# Patient Record
Sex: Male | Born: 1940 | Race: White | Hispanic: No | Marital: Single | Smoking: Former smoker
Health system: Southern US, Community
[De-identification: ages and names within clinical notes are randomized; demographics above are authoritative.]

## PROBLEM LIST (undated history)

## (undated) DIAGNOSIS — I10 Essential (primary) hypertension: Secondary | ICD-10-CM

## (undated) DIAGNOSIS — I739 Peripheral vascular disease, unspecified: Secondary | ICD-10-CM

## (undated) DIAGNOSIS — M797 Fibromyalgia: Secondary | ICD-10-CM

## (undated) DIAGNOSIS — J449 Chronic obstructive pulmonary disease, unspecified: Secondary | ICD-10-CM

## (undated) DIAGNOSIS — G629 Polyneuropathy, unspecified: Secondary | ICD-10-CM

## (undated) DIAGNOSIS — E118 Type 2 diabetes mellitus with unspecified complications: Secondary | ICD-10-CM

## (undated) DIAGNOSIS — I779 Disorder of arteries and arterioles, unspecified: Secondary | ICD-10-CM

## (undated) DIAGNOSIS — E785 Hyperlipidemia, unspecified: Secondary | ICD-10-CM

## (undated) DIAGNOSIS — C801 Malignant (primary) neoplasm, unspecified: Secondary | ICD-10-CM

## (undated) DIAGNOSIS — I5189 Other ill-defined heart diseases: Secondary | ICD-10-CM

## (undated) DIAGNOSIS — R001 Bradycardia, unspecified: Secondary | ICD-10-CM

## (undated) DIAGNOSIS — I2510 Atherosclerotic heart disease of native coronary artery without angina pectoris: Secondary | ICD-10-CM

## (undated) DIAGNOSIS — D126 Benign neoplasm of colon, unspecified: Secondary | ICD-10-CM

## (undated) HISTORY — DX: Atherosclerotic heart disease of native coronary artery without angina pectoris: I25.10

## (undated) HISTORY — DX: Malignant (primary) neoplasm, unspecified: C80.1

## (undated) HISTORY — DX: Essential (primary) hypertension: I10

## (undated) HISTORY — DX: Benign neoplasm of colon, unspecified: D12.6

## (undated) HISTORY — DX: Chronic obstructive pulmonary disease, unspecified: J44.9

## (undated) HISTORY — DX: Disorder of arteries and arterioles, unspecified: I77.9

## (undated) HISTORY — PX: EYE SURGERY: SHX253

## (undated) HISTORY — PX: CAROTID ENDARTERECTOMY: SUR193

## (undated) HISTORY — DX: Type 2 diabetes mellitus with unspecified complications: E11.8

## (undated) HISTORY — DX: Hyperlipidemia, unspecified: E78.5

## (undated) HISTORY — PX: SKIN CANCER EXCISION: SHX779

## (undated) HISTORY — DX: Other ill-defined heart diseases: I51.89

## (undated) HISTORY — DX: Bradycardia, unspecified: R00.1

## (undated) HISTORY — DX: Peripheral vascular disease, unspecified: I73.9

## (undated) HISTORY — DX: Polyneuropathy, unspecified: G62.9

---

## 2006-03-08 ENCOUNTER — Emergency Department: Payer: Self-pay | Admitting: Unknown Physician Specialty

## 2008-08-23 ENCOUNTER — Ambulatory Visit: Payer: Self-pay | Admitting: Vascular Surgery

## 2008-10-10 ENCOUNTER — Ambulatory Visit: Payer: Self-pay | Admitting: Vascular Surgery

## 2008-10-17 ENCOUNTER — Inpatient Hospital Stay: Payer: Self-pay | Admitting: Vascular Surgery

## 2013-01-26 ENCOUNTER — Ambulatory Visit: Payer: Self-pay | Admitting: Internal Medicine

## 2013-04-07 ENCOUNTER — Ambulatory Visit: Payer: Self-pay | Admitting: Internal Medicine

## 2013-04-17 ENCOUNTER — Ambulatory Visit: Payer: Self-pay | Admitting: Gastroenterology

## 2013-04-17 LAB — CBC WITH DIFFERENTIAL/PLATELET
Basophil %: 1.2 %
Eosinophil #: 0.2 10*3/uL (ref 0.0–0.7)
Lymphocyte #: 1.3 10*3/uL (ref 1.0–3.6)
Lymphocyte %: 19.4 %
MCH: 33.3 pg (ref 26.0–34.0)
MCHC: 34.8 g/dL (ref 32.0–36.0)
MCV: 96 fL (ref 80–100)
Neutrophil #: 4.4 10*3/uL (ref 1.4–6.5)
Neutrophil %: 65.1 %
Platelet: 243 10*3/uL (ref 150–440)
WBC: 6.8 10*3/uL (ref 3.8–10.6)

## 2013-04-17 LAB — PROTIME-INR: Prothrombin Time: 12.7 secs (ref 11.5–14.7)

## 2013-06-28 ENCOUNTER — Ambulatory Visit: Payer: Self-pay | Admitting: Ophthalmology

## 2014-06-06 ENCOUNTER — Emergency Department: Payer: Self-pay | Admitting: Emergency Medicine

## 2014-06-06 LAB — BASIC METABOLIC PANEL
ANION GAP: 10 (ref 7–16)
BUN: 13 mg/dL (ref 7–18)
CALCIUM: 9.1 mg/dL (ref 8.5–10.1)
CHLORIDE: 105 mmol/L (ref 98–107)
CO2: 24 mmol/L (ref 21–32)
Creatinine: 1.05 mg/dL (ref 0.60–1.30)
EGFR (African American): >60
EGFR (Non-African Amer.): >60
Glucose: 138 mg/dL — ABNORMAL HIGH (ref 65–99)
OSMOLALITY: 280 (ref 275–301)
Potassium: 4.3 mmol/L (ref 3.5–5.1)
SODIUM: 139 mmol/L (ref 136–145)

## 2014-06-06 LAB — URINALYSIS, COMPLETE
BILIRUBIN, UR: NEGATIVE
Bacteria: NONE SEEN
Blood: NEGATIVE
GLUCOSE, UR: NEGATIVE mg/dL (ref 0–75)
Ketone: NEGATIVE
Leukocyte Esterase: NEGATIVE
Nitrite: NEGATIVE
PH: 5 (ref 4.5–8.0)
Protein: NEGATIVE
Specific Gravity: 1.029 (ref 1.003–1.030)
Squamous Epithelial: NONE SEEN

## 2014-06-06 LAB — CBC
HCT: 46.5 % (ref 40.0–52.0)
HGB: 15.9 g/dL (ref 13.0–18.0)
MCH: 34.9 pg — AB (ref 26.0–34.0)
MCHC: 34.1 g/dL (ref 32.0–36.0)
MCV: 102 fL — ABNORMAL HIGH (ref 80–100)
Platelet: 153 10*3/uL (ref 150–440)
RBC: 4.55 10*6/uL (ref 4.40–5.90)
RDW: 14.6 % — ABNORMAL HIGH (ref 11.5–14.5)
WBC: 7.6 10*3/uL (ref 3.8–10.6)

## 2014-06-06 LAB — CK TOTAL AND CKMB (NOT AT ARMC)
CK, Total: 109 U/L
CK-MB: 1.6 ng/mL (ref 0.5–3.6)

## 2014-06-06 LAB — TROPONIN I: Troponin-I: <0.02 ng/mL

## 2016-04-27 ENCOUNTER — Ambulatory Visit
Admission: EM | Admit: 2016-04-27 | Discharge: 2016-04-27 | Disposition: A | Discharge status: Home / Self Care | Payer: Medicare Other | Attending: Family Medicine | Admitting: Family Medicine

## 2016-04-27 ENCOUNTER — Encounter: Payer: Self-pay | Admitting: *Deleted

## 2016-04-27 ENCOUNTER — Ambulatory Visit (INDEPENDENT_AMBULATORY_CARE_PROVIDER_SITE_OTHER): Payer: Medicare Other

## 2016-04-27 DIAGNOSIS — M1000 Idiopathic gout, unspecified site: Secondary | ICD-10-CM

## 2016-04-27 DIAGNOSIS — S63501A Unspecified sprain of right wrist, initial encounter: Secondary | ICD-10-CM

## 2016-04-27 HISTORY — DX: Fibromyalgia: M79.7

## 2016-04-27 LAB — URIC ACID: URIC ACID, SERUM: 7.3 mg/dL (ref 4.4–7.6)

## 2016-04-27 MED ORDER — PREDNISONE 20 MG PO TABS
ORAL_TABLET | ORAL | 0 refills | Status: DC
Start: 2016-04-27 — End: 2018-07-09

## 2016-04-27 NOTE — ED Triage Notes (Signed)
Pt has hx of previous right wrist fx with recurrent pain that usually resolves spontaneously but this time has persisted for more than a week and is not improving.  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 Wrist is edematous.

## 2016-04-27 NOTE — ED Provider Notes (Signed)
[facility]    CSN: [id number] Arrival date & time: 04/27/16  1004  First Provider Contact:  First MD Initiated Contact with Patient 04/27/16 1124        History   Chief Complaint Chief Complaint  Patient presents with  . Wrist Pain    HPI Gerald NOWLING Sr. is a 75 y.o. male.   HPI: Patient presents today with symptoms of right wrist pain. Patient states that he's had the symptoms for the last 5 or 6 days. He denies any trauma or injury to the area. He states that gradually it has started to get worse over the course of the last few days. He does describe some warmth to the area and swelling. He denies any other joint pain. He denies any fever. There is not one specific spot on the wrist that bothers him. He denies any history of gout. Denies any recent increase of seafood, red meat or alcohol.  Past Medical History:  Diagnosis Date  . Fibromyalgia     There are no active problems to display for this patient.   Past Surgical History:  Procedure Laterality Date  . CAROTID ENDARTERECTOMY Right   . EYE SURGERY         Home Medications    Prior to Admission medications   Medication Sig Start Date End Date Taking? Authorizing Provider  acetaminophen (TYLENOL) 500 MG tablet Take 1,000 mg by mouth every 6 (six) hours as needed.   Yes Historical Provider, MD  aspirin 81 MG tablet Take 81 mg by mouth daily.   Yes Historical Provider, MD  omeprazole (PRILOSEC) 20 MG capsule Take 20 mg by mouth daily.   Yes Historical Provider, MD  predniSONE (DELTASONE) 20 MG tablet Take 2 tabs PO qd x 3 days. 04/27/16   Paulina Fusi, MD    Family History History reviewed. No pertinent family history.  Social History Social History  Substance Use Topics  . Smoking status: Former Research scientist (life sciences)  . Smokeless tobacco: Current User  . Alcohol use Yes     Allergies   Review of patient's allergies indicates no known allergies.   Review of Systems Review of Systems: Negative  except mentioned above.   Physical Exam Triage Vital Signs ED Triage Vitals [04/27/16 1037]  Enc Vitals Group     BP (!) 148/66     Pulse Rate 60     Resp 16     Temp 97.8 F (36.6 C)     Temp Source Oral     SpO2 98 %     Weight 170 lb (77.1 kg)     Height 5\' 10"  (1.778 m)     Head Circumference      Peak Flow      Pain Score      Pain Loc      Pain Edu?      Excl. in [place]?    No data found.   Updated Vital Signs BP (!) 148/66 (BP Location: Left Arm)   Pulse 60   Temp 97.8 F (36.6 C) (Oral)   Resp 16   Ht 5\' 10"  (1.778 m)   Wt 170 lb (77.1 kg)   SpO2 98%   BMI 24.39 kg/m      Physical Exam :  GENERAL: NAD RESP: CTA B CARD: RRR MSK: R Wrist-mild to moderate swelling of right wrist, mild warmth of area, decreased ROM, generalized tenderness of wrist, nv intact  NEURO: CN II-XII grossly intact   UC  Treatments / Results  Labs (all labs ordered are listed, but only abnormal results are displayed) Labs Reviewed  URIC ACID    EKG  EKG Interpretation None       Radiology Dg Wrist Complete Right  Result Date: 04/27/2016 CLINICAL DATA:  Right wrist pain over the past 1 week. No known injury. Initial encounter. EXAM: RIGHT WRIST - COMPLETE 3+ VIEW COMPARISON:  None. FINDINGS: No acute bony or joint abnormality is seen. 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 Joint spaces are preserved. No focal bone lesion. IMPRESSION: No acute abnormality. Chondrocalcinosis. Electronically Signed   By: Inge Rise M.D.   On: 04/27/2016 11:26    Procedures Procedures (including critical care time)  Medications Ordered in UC Medications - No data to display   Initial Impression / Assessment and Plan / UC Course  I have reviewed the triage vital signs and the nursing notes.  Pertinent labs & imaging results that were available during my care of the patient were reviewed by me and considered in my medical decision making (see chart for details).  Clinical  Course  A/P: R Wrist Pain- x-ray results discussed with patient, uric acid level upper limits of normal, will place patient on a few days of prednisone in case patient is having a flare of gout. Patient states that he has taken prednisone in the past before. I have advised that he not take NSAIDs while on the prednisone but can take Tylenol. He can use his wrist splint that he has if needed. Ice, elevation when necessary. Would follow-up with orthopedics or primary care physician if symptoms persist or worsen as discussed.  Final Clinical Impressions(s) / UC Diagnoses   Final diagnoses:  Right wrist sprain, initial encounter  Acute idiopathic gout, unspecified site    New Prescriptions New Prescriptions   PREDNISONE (DELTASONE) 20 MG TABLET    Take 2 tabs PO qd x 3 days.     Paulina Fusi, MD 04/27/16 [phone]

## 2016-07-21 ENCOUNTER — Encounter: Payer: Self-pay | Admitting: *Deleted

## 2016-07-21 ENCOUNTER — Ambulatory Visit: Payer: Medicare Other | Admitting: Anesthesiology

## 2016-07-21 ENCOUNTER — Ambulatory Visit
Admission: RE | Admit: 2016-07-21 | Discharge: 2016-07-21 | Disposition: A | Discharge status: Home / Self Care | Payer: Medicare Other | Source: Ambulatory Visit | Attending: Gastroenterology | Admitting: Gastroenterology

## 2016-07-21 ENCOUNTER — Encounter
Admission: RE | Disposition: A | Discharge status: Home / Self Care | Payer: Self-pay | Source: Ambulatory Visit | Attending: Gastroenterology

## 2016-07-21 DIAGNOSIS — K635 Polyp of colon: Secondary | ICD-10-CM | POA: Insufficient documentation

## 2016-07-21 DIAGNOSIS — M797 Fibromyalgia: Secondary | ICD-10-CM | POA: Diagnosis not present

## 2016-07-21 DIAGNOSIS — Z7982 Long term (current) use of aspirin: Secondary | ICD-10-CM | POA: Insufficient documentation

## 2016-07-21 DIAGNOSIS — Z1211 Encounter for screening for malignant neoplasm of colon: Secondary | ICD-10-CM | POA: Diagnosis not present

## 2016-07-21 DIAGNOSIS — K5730 Diverticulosis of large intestine without perforation or abscess without bleeding: Secondary | ICD-10-CM | POA: Insufficient documentation

## 2016-07-21 DIAGNOSIS — Z87891 Personal history of nicotine dependence: Secondary | ICD-10-CM | POA: Diagnosis not present

## 2016-07-21 DIAGNOSIS — Z86010 Personal history of colonic polyps: Secondary | ICD-10-CM | POA: Insufficient documentation

## 2016-07-21 DIAGNOSIS — D122 Benign neoplasm of ascending colon: Secondary | ICD-10-CM | POA: Diagnosis not present

## 2016-07-21 HISTORY — PX: COLONOSCOPY: SHX5424

## 2016-07-21 LAB — CBC
HCT: 47.3 % (ref 40.0–52.0)
HEMOGLOBIN: 16.1 g/dL (ref 13.0–18.0)
MCH: 33.8 pg (ref 26.0–34.0)
MCHC: 33.9 g/dL (ref 32.0–36.0)
MCV: 99.4 fL (ref 80.0–100.0)
Platelets: 152 10*3/uL (ref 150–440)
RBC: 4.76 MIL/uL (ref 4.40–5.90)
RDW: 13.6 % (ref 11.5–14.5)
WBC: 8 10*3/uL (ref 3.8–10.6)

## 2016-07-21 LAB — PROTIME-INR
INR: 0.96
Prothrombin Time: 12.8 seconds (ref 11.4–15.2)

## 2016-07-21 SURGERY — COLONOSCOPY
Anesthesia: General

## 2016-07-21 MED ORDER — EPHEDRINE SULFATE 50 MG/ML IJ SOLN
INTRAMUSCULAR | Status: DC | PRN
  Administered 2016-07-21: 10 mg via INTRAVENOUS

## 2016-07-21 MED ORDER — SODIUM CHLORIDE 0.9 % IV SOLN: INTRAVENOUS | Status: DC

## 2016-07-21 MED ORDER — FENTANYL CITRATE (PF) 100 MCG/2ML IJ SOLN
INTRAMUSCULAR | Status: DC | PRN
  Administered 2016-07-21: 50 ug via INTRAVENOUS

## 2016-07-21 MED ORDER — MIDAZOLAM HCL 2 MG/2ML IJ SOLN
INTRAMUSCULAR | Status: DC | PRN
  Administered 2016-07-21: 1 mg via INTRAVENOUS

## 2016-07-21 MED ORDER — SODIUM CHLORIDE 0.9 % IV SOLN
INTRAVENOUS | Status: DC
  Administered 2016-07-21: 07:00:00 via INTRAVENOUS

## 2016-07-21 MED ORDER — PROPOFOL 500 MG/50ML IV EMUL
INTRAVENOUS | Status: DC | PRN
  Administered 2016-07-21: 120 ug/kg/min via INTRAVENOUS

## 2016-07-21 NOTE — H&P (Deleted)
Outpatient short stay form Pre-procedure 07/21/2016 7:45 AM Gerald Mcgrath U Maliea Grandmaison MD  Primary Physician: Dr. Mark Crissman  Reason for visit:  Colonoscopy  History of present illness:  Patient is a 75-year-old male presenting today as above. He has a personal history of adenomatous colon polyps. He does take Plavix but stopped that about 6 days ago. He does take a daily 81 mg aspirin withheld at this morning. He tolerated his prep well.    Current Facility-Administered Medications:  .  0.9 %  sodium chloride infusion, , Intravenous, Continuous, Cheston Coury U Zaccheus Edmister, MD, Last Rate: 20 mL/hr at 07/21/16 0723 .  0.9 %  sodium chloride infusion, , Intravenous, Continuous, Jibril Mcminn U Morgane Joerger, MD  Prescriptions Prior to Admission  Medication Sig Dispense Refill Last Dose  . acetaminophen (TYLENOL) 500 MG tablet Take 1,000 mg by mouth every 6 (six) hours as needed.   Past Month at Unknown time  . aspirin 81 MG tablet Take 81 mg by mouth daily.   Past Week at Unknown time  . omeprazole (PRILOSEC) 20 MG capsule Take 20 mg by mouth daily.   Not Taking at Unknown time  . predniSONE (DELTASONE) 20 MG tablet Take 2 tabs PO qd x 3 days. (Patient not taking: Reported on 07/21/2016) 6 tablet 0 Completed Course at Unknown time     No Known Allergies   Past Medical History:  Diagnosis Date  . Fibromyalgia   . Wrist pain    right. to see orthopedist next week    Review of systems:      Physical Exam    Heart and lungs: Regular rate and rhythm without rub or gallop, lungs are bilaterally clear.    HEENT: Normocephalic atraumatic eyes are anicteric    Other:     Pertinant exam for procedure: Soft nontender nondistended bowel sounds positive normoactive.    Planned proceedures: Colonoscopy and indicated procedures. I have discussed the risks benefits and complications of procedures to include not limited to bleeding, infection, perforation and the risk of sedation and the patient wishes to  proceed.    Gerald Mcgrath U Gerald Ghosh, MD Gastroenterology 07/21/2016  7:45 AM     

## 2016-07-21 NOTE — H&P (Signed)
Outpatient short stay form Pre-procedure 07/21/2016 8:42 AM Lollie Sails MD  Primary Physician: Dr. Fulton Reek  Reason for visit:  Colonoscopy  History of present illness:  Patient is a 75 year old male presenting today as above. He has personal history of adenomatous colon polyps with a large adenomatous polyp being removed from the rectum. This was on 04/17/2013. He is presenting today for follow-up colonoscopy. He tolerated his prep well. He takes no blood thinning agents. He takes no aspirin products with the exception of 81 mg aspirin.    Current Facility-Administered Medications:  .  0.9 %  sodium chloride infusion, , Intravenous, Continuous, Lollie Sails, MD, Last Rate: 20 mL/hr at 07/21/16 0723 .  0.9 %  sodium chloride infusion, , Intravenous, Continuous, Lollie Sails, MD .  0.9 %  sodium chloride infusion, , Intravenous, Continuous, Lollie Sails, MD .  0.9 %  sodium chloride infusion, , Intravenous, Continuous, Lollie Sails, MD  Prescriptions Prior to Admission  Medication Sig Dispense Refill Last Dose  . acetaminophen (TYLENOL) 500 MG tablet Take 1,000 mg by mouth every 6 (six) hours as needed.   Past Month at Unknown time  . aspirin 81 MG tablet Take 81 mg by mouth daily.   Past Week at Unknown time  . omeprazole (PRILOSEC) 20 MG capsule Take 20 mg by mouth daily.   Not Taking at Unknown time  . predniSONE (DELTASONE) 20 MG tablet Take 2 tabs PO qd x 3 days. (Patient not taking: Reported on 07/21/2016) 6 tablet 0 Completed Course at Unknown time     No Known Allergies   Past Medical History:  Diagnosis Date  . Fibromyalgia   . Wrist pain    right. to see orthopedist next week    Review of systems:      Physical Exam    Heart and lungs: Regular rate and rhythm without rub or gallop, lungs are bilaterally clear.    HEENT: Normocephalic atraumatic eyes are anicteric    Other:     Pertinant exam for procedure: Soft nontender  nondistended bowel sounds positive normoactive.    Planned proceedures: Colonoscopy and indicated procedures. I have discussed the risks benefits and complications of procedures to include not limited to bleeding, infection, perforation and the risk of sedation and the patient wishes to proceed.    Lollie Sails, MD Gastroenterology 07/21/2016  8:42 AM

## 2016-07-21 NOTE — Anesthesia Preprocedure Evaluation (Signed)
Anesthesia Evaluation  Patient identified by MRN, date of birth, ID band Patient awake    Reviewed: Allergy & Precautions, NPO status , Patient's Chart, lab work & pertinent test results  History of Anesthesia Complications Negative for: history of anesthetic complications  Airway Mallampati: II  TM Distance: >3 FB Neck ROM: Full    Dental  (+) Poor Dentition   Pulmonary neg sleep apnea, neg COPD, former smoker,    breath sounds clear to auscultation- rhonchi (-) wheezing      Cardiovascular Exercise Tolerance: Good (-) hypertension(-) CAD and (-) Past MI  Rhythm:Regular Rate:Normal - Systolic murmurs and - Diastolic murmurs    Neuro/Psych negative neurological ROS  negative psych ROS   GI/Hepatic negative GI ROS, Neg liver ROS,   Endo/Other  negative endocrine ROSneg diabetes  Renal/GU negative Renal ROS     Musculoskeletal  (+) Fibromyalgia -  Abdominal (+) - obese,   Peds  Hematology negative hematology ROS (+)   Anesthesia Other Findings Past Medical History: No date: Fibromyalgia No date: Wrist pain     Comment: right. to see orthopedist next week   Reproductive/Obstetrics                             Anesthesia Physical Anesthesia Plan  ASA: II  Anesthesia Plan: General   Post-op Pain Management:    Induction: Intravenous  Airway Management Planned: Natural Airway  Additional Equipment:   Intra-op Plan:   Post-operative Plan:   Informed Consent: I have reviewed the patients History and Physical, chart, labs and discussed the procedure including the risks, benefits and alternatives for the proposed anesthesia with the patient or authorized representative who has indicated his/her understanding and acceptance.   Dental advisory given  Plan Discussed with: CRNA and Anesthesiologist  Anesthesia Plan Comments:         Anesthesia Quick Evaluation

## 2016-07-21 NOTE — Anesthesia Postprocedure Evaluation (Signed)
Anesthesia Post Note  Patient: KOHLMAN BIANCA Sr.  Procedure(s) Performed: Procedure(s) (LRB): COLONOSCOPY (N/A)  Patient location during evaluation: Endoscopy Anesthesia Type: General Level of consciousness: awake and alert and oriented Pain management: pain level controlled Vital Signs Assessment: post-procedure vital signs reviewed and stable Respiratory status: spontaneous breathing, nonlabored ventilation and respiratory function stable Cardiovascular status: blood pressure returned to baseline and stable Postop Assessment: no signs of nausea or vomiting Anesthetic complications: no    Last Vitals:  Vitals:   07/21/16 0950 07/21/16 1000  BP: 131/72 131/72  Pulse:    Resp:    Temp:      Last Pain:  Vitals:   07/21/16 0930  TempSrc: Tympanic                 Latishia Suitt

## 2016-07-21 NOTE — Transfer of Care (Signed)
Immediate Anesthesia Transfer of Care Note  Patient: Gerald Mcgrath.  Procedure(s) Performed: Procedure(s) with comments: COLONOSCOPY (N/A) - CBC; PT/ INR  Patient Location: PACU  Anesthesia Type:General  Level of Consciousness: awake and sedated  Airway & Oxygen Therapy: Patient Spontanous Breathing and Patient connected to nasal cannula oxygen  Post-op Assessment: Report given to RN and Post -op Vital signs reviewed and stable  Post vital signs: Reviewed and stable  Last Vitals:  Vitals:   07/21/16 0724  BP: (!) 149/66  Pulse: 65  Resp: 18  Temp: (!) 35.8 C    Last Pain:  Vitals:   07/21/16 0724  TempSrc: Tympanic         Complications: No apparent anesthesia complications

## 2016-07-21 NOTE — Op Note (Signed)
[facility] Patient Name: Gerald Mcgrath Procedure Date: 07/21/2016 8:51 AM MRN: [id number] Account #: [id number] Date of Birth: September 28, 1940 Admit Type: Outpatient Age: 75 Room: [facility] ENDO ROOM 3 Gender: Male Note Status: Finalized Procedure:            Colonoscopy Indications:          Personal history of colonic polyps Providers:            Lollie Sails, MD Referring MD:         Leonie Douglas. Doy Hutching, MD (Referring MD) Medicines:            Monitored Anesthesia Care Complications:        No immediate complications. Procedure:            Pre-Anesthesia Assessment:                       - ASA Grade Assessment: II - A patient with mild                        systemic disease.                       After obtaining informed consent, the colonoscope was                        passed under direct vision. Throughout the procedure,                        the patient's blood pressure, pulse, and oxygen                        saturations were monitored continuously. The                        Colonoscope was introduced through the anus and                        advanced to the the cecum, identified by appendiceal                        orifice and ileocecal valve. The colonoscopy was                        performed without difficulty. The patient tolerated the                        procedure well. The quality of the bowel preparation                        was good. Findings:      A 3 mm polyp was found in the ascending colon. The polyp was sessile.       The polyp was removed with a cold biopsy forceps. Resection and       retrieval were complete.      A 5 mm polyp was found in the mid sigmoid colon. The polyp was sessile.       The polyp was removed with a cold snare. Resection and retrieval were       complete.      A 3 mm polyp was found in the mid sigmoid colon. The polyp was sessile.  The polyp was removed with a cold biopsy forceps.  Resection and       retrieval were complete.      Multiple small to medium diverticula were found in the sigmoid colon,       descending colon, transverse colon and ascending colon.      The digital rectal exam was normal. Impression:           - One 3 mm polyp in the ascending colon, removed with a                        cold biopsy forceps. Resected and retrieved.                       - One 5 mm polyp in the mid sigmoid colon, removed with                        a cold snare. Resected and retrieved.                       - One 3 mm polyp in the mid sigmoid colon, removed with                        a cold biopsy forceps. Resected and retrieved.                       - Diverticulosis in the sigmoid colon, in the                        descending colon, in the transverse colon and in the                        ascending colon. Recommendation:       - Discharge patient to home.                       - Telephone GI clinic for pathology results in 1 week. Procedure Code(s):    --- Professional ---                       [phone], Colonoscopy, flexible; with removal of tumor(s),                        polyp(s), or other lesion(s) by snare technique                       [id number], 59, Colonoscopy, flexible; with biopsy, single                        or multiple Diagnosis Code(s):    --- Professional ---                       D12.2, Benign neoplasm of ascending colon                       D12.5, Benign neoplasm of sigmoid colon                       Z86.010, Personal history of colonic polyps  K57.30, Diverticulosis of large intestine without                        perforation or abscess without bleeding CPT copyright 2016 American Medical Association. All rights reserved. The codes documented in this report are preliminary and upon coder review may  be revised to meet current compliance requirements. Lollie Sails, MD 07/21/2016 9:30:16 AM This report has been signed  electronically. Number of Addenda: 0 Note Initiated On: 07/21/2016 8:51 AM Scope Withdrawal Time: 0 hours 15 minutes 19 seconds  Total Procedure Duration: 0 hours 24 minutes 37 seconds       [facility]

## 2016-07-21 NOTE — Anesthesia Procedure Notes (Signed)
Performed by: COOK-MARTIN, Akil Hoos Pre-anesthesia Checklist: Patient identified, Emergency Drugs available, Suction available, Patient being monitored and Timeout performed Patient Re-evaluated:Patient Re-evaluated prior to inductionOxygen Delivery Method: Nasal cannula Preoxygenation: Pre-oxygenation with 100% oxygen Intubation Type: IV induction Placement Confirmation: positive ETCO2 and CO2 detector       

## 2016-07-22 ENCOUNTER — Encounter: Payer: Self-pay | Admitting: Gastroenterology

## 2016-07-22 LAB — SURGICAL PATHOLOGY

## 2017-03-15 ENCOUNTER — Ambulatory Visit
Admission: EM | Admit: 2017-03-15 | Discharge: 2017-03-15 | Discharge status: Absent without leave | Payer: Medicare Other

## 2018-01-28 NOTE — Discharge Instructions (Signed)

## 2018-02-02 ENCOUNTER — Ambulatory Visit
Admission: RE | Admit: 2018-02-02 | Discharge: 2018-02-02 | Disposition: A | Discharge status: Home / Self Care | Payer: Medicare Other | Source: Ambulatory Visit | Attending: Ophthalmology | Admitting: Ophthalmology

## 2018-02-02 ENCOUNTER — Ambulatory Visit: Payer: Medicare Other | Admitting: Anesthesiology

## 2018-02-02 ENCOUNTER — Encounter
Admission: RE | Disposition: A | Discharge status: Home / Self Care | Payer: Self-pay | Source: Ambulatory Visit | Attending: Ophthalmology

## 2018-02-02 DIAGNOSIS — Z79899 Other long term (current) drug therapy: Secondary | ICD-10-CM | POA: Insufficient documentation

## 2018-02-02 DIAGNOSIS — M797 Fibromyalgia: Secondary | ICD-10-CM | POA: Diagnosis not present

## 2018-02-02 DIAGNOSIS — E119 Type 2 diabetes mellitus without complications: Secondary | ICD-10-CM | POA: Diagnosis not present

## 2018-02-02 DIAGNOSIS — H2512 Age-related nuclear cataract, left eye: Secondary | ICD-10-CM | POA: Diagnosis present

## 2018-02-02 DIAGNOSIS — Z7982 Long term (current) use of aspirin: Secondary | ICD-10-CM | POA: Diagnosis not present

## 2018-02-02 DIAGNOSIS — E7800 Pure hypercholesterolemia, unspecified: Secondary | ICD-10-CM | POA: Diagnosis not present

## 2018-02-02 DIAGNOSIS — F419 Anxiety disorder, unspecified: Secondary | ICD-10-CM | POA: Diagnosis not present

## 2018-02-02 DIAGNOSIS — Z720 Tobacco use: Secondary | ICD-10-CM | POA: Insufficient documentation

## 2018-02-02 DIAGNOSIS — K219 Gastro-esophageal reflux disease without esophagitis: Secondary | ICD-10-CM | POA: Diagnosis not present

## 2018-02-02 HISTORY — PX: CATARACT EXTRACTION W/PHACO: SHX586

## 2018-02-02 LAB — GLUCOSE, CAPILLARY: GLUCOSE-CAPILLARY: 112 mg/dL — AB (ref 65–99)

## 2018-02-02 SURGERY — PHACOEMULSIFICATION, CATARACT, WITH IOL INSERTION
Anesthesia: Monitor Anesthesia Care | Site: Eye | Laterality: Left | Wound class: "Clean "

## 2018-02-02 MED ORDER — ARMC OPHTHALMIC DILATING DROPS
1.0000 "application " | OPHTHALMIC | Status: DC | PRN
  Administered 2018-02-02 (×3): 1 via OPHTHALMIC

## 2018-02-02 MED ORDER — NA HYALUR & NA CHOND-NA HYALUR 0.4-0.35 ML IO KIT
PACK | INTRAOCULAR | Status: DC | PRN
  Administered 2018-02-02: 1 mL via INTRAOCULAR

## 2018-02-02 MED ORDER — BALANCED SALT IO SOLN
INTRAOCULAR | Status: DC | PRN
  Administered 2018-02-02: 1 mL

## 2018-02-02 MED ORDER — EPINEPHRINE PF 1 MG/ML IJ SOLN
INTRAOCULAR | Status: DC | PRN
  Administered 2018-02-02: 69 mL via OPHTHALMIC

## 2018-02-02 MED ORDER — LACTATED RINGERS IV SOLN: INTRAVENOUS | Status: DC

## 2018-02-02 MED ORDER — FENTANYL CITRATE (PF) 100 MCG/2ML IJ SOLN
INTRAMUSCULAR | Status: DC | PRN
  Administered 2018-02-02: 50 ug via INTRAVENOUS

## 2018-02-02 MED ORDER — BRIMONIDINE TARTRATE-TIMOLOL 0.2-0.5 % OP SOLN
OPHTHALMIC | Status: DC | PRN
  Administered 2018-02-02: 1 [drp] via OPHTHALMIC

## 2018-02-02 MED ORDER — MIDAZOLAM HCL 2 MG/2ML IJ SOLN
INTRAMUSCULAR | Status: DC | PRN
  Administered 2018-02-02: 1 mg via INTRAVENOUS

## 2018-02-02 MED ORDER — MOXIFLOXACIN HCL 0.5 % OP SOLN
1.0000 [drp] | OPHTHALMIC | Status: DC | PRN
  Administered 2018-02-02 (×3): 1 [drp] via OPHTHALMIC

## 2018-02-02 MED ORDER — MOXIFLOXACIN HCL 0.5 % OP SOLN
OPHTHALMIC | Status: DC | PRN
  Administered 2018-02-02: 1 [drp] via OPHTHALMIC

## 2018-02-02 SURGICAL SUPPLY — 20 items
CANNULA ANT/CHMB 27G (MISCELLANEOUS) ×1 IMPLANT
CANNULA ANT/CHMB 27GA (MISCELLANEOUS) ×3 IMPLANT
GLOVE SURG LX 7.5 STRW (GLOVE) ×2
GLOVE SURG LX STRL 7.5 STRW (GLOVE) ×1 IMPLANT
GLOVE SURG TRIUMPH 8.0 PF LTX (GLOVE) ×3 IMPLANT
GOWN STRL REUS W/ TWL LRG LVL3 (GOWN DISPOSABLE) ×2 IMPLANT
GOWN STRL REUS W/TWL LRG LVL3 (GOWN DISPOSABLE) ×4
LENS IOL TECNIS ITEC 20.5 (Intraocular Lens) ×2 IMPLANT
MARKER SKIN DUAL TIP RULER LAB (MISCELLANEOUS) ×3 IMPLANT
NDL FILTER BLUNT 18X1 1/2 (NEEDLE) ×1 IMPLANT
NEEDLE FILTER BLUNT 18X 1/2SAF (NEEDLE) ×2
NEEDLE FILTER BLUNT 18X1 1/2 (NEEDLE) ×1 IMPLANT
PACK CATARACT BRASINGTON (MISCELLANEOUS) ×3 IMPLANT
PACK EYE AFTER SURG (MISCELLANEOUS) ×3 IMPLANT
PACK OPTHALMIC (MISCELLANEOUS) ×3 IMPLANT
SYR 3ML LL SCALE MARK (SYRINGE) ×3 IMPLANT
SYR 5ML LL (SYRINGE) ×3 IMPLANT
SYR TB 1ML LUER SLIP (SYRINGE) ×3 IMPLANT
WATER STERILE IRR 500ML POUR (IV SOLUTION) ×3 IMPLANT
WIPE NON LINTING 3.25X3.25 (MISCELLANEOUS) ×3 IMPLANT

## 2018-02-02 NOTE — H&P (Signed)
The History and Physical notes are on paper, have been signed, and are to be scanned. The patient remains stable and unchanged from the H&P.   Previous H&P reviewed, patient examined, and there are no changes.  Gerald Mcgrath 02/02/2018 7:41 AM

## 2018-02-02 NOTE — Op Note (Signed)
OPERATIVE NOTE  MANU RUBEY Sr. [id number] 02/02/2018   PREOPERATIVE DIAGNOSIS:  Nuclear sclerotic cataract left eye. H25.12   POSTOPERATIVE DIAGNOSIS:    Nuclear sclerotic cataract left eye.     PROCEDURE:  Phacoemusification with posterior chamber intraocular lens placement of the left eye   LENS:   Implant Name Type Inv. Item Serial No. Manufacturer Lot No. LRB No. Used  LENS IOL DIOP 20.5 - [id number] Intraocular Lens LENS IOL DIOP 20.5 [id number]  Left 1        ULTRASOUND TIME: 17  % of 1 minutes 40 seconds, CDE 17.2  SURGEON:  Wyonia Hough, MD   ANESTHESIA:  Topical with tetracaine drops and 2% Xylocaine jelly, augmented with 1% preservative-free intracameral lidocaine.    COMPLICATIONS:  None.   DESCRIPTION OF PROCEDURE:  The patient was identified in the holding room and transported to the operating room and placed in the supine position under the operating microscope.  The left eye was identified as the operative eye and it was prepped and draped in the usual sterile ophthalmic fashion.   A 1 millimeter clear-corneal paracentesis was made at the 1:30 position.  0.5 ml of preservative-free 1% lidocaine was injected into the anterior chamber.  The anterior chamber was filled with Viscoat viscoelastic.  A 2.4 millimeter keratome was used to make a near-clear corneal incision at the 10:30 position.  .  A curvilinear capsulorrhexis was made with a cystotome and capsulorrhexis forceps.  Balanced salt solution was used to hydrodissect and hydrodelineate the nucleus.   Phacoemulsification was then used in stop and chop fashion to remove the lens nucleus and epinucleus.  The remaining cortex was then removed using the irrigation and aspiration handpiece. Provisc was then placed into the capsular bag to distend it for lens placement.  A lens was then injected into the capsular bag.  The remaining viscoelastic was aspirated.   Wounds were hydrated with balanced salt  solution.  The anterior chamber was inflated to a physiologic pressure with balanced salt solution.  No wound leaks were noted. Vigamox 0.2 ml of a 1mg  per ml solution was injected into the anterior chamber for a dose of 0.2 mg of intracameral antibiotic at the completion of the case.   Timolol and Brimonidine drops were applied to the eye.  The patient was taken to the recovery room in stable condition without complications of anesthesia or surgery.  Debar Plate 02/02/2018, 8:03 AM

## 2018-02-02 NOTE — Anesthesia Preprocedure Evaluation (Signed)
Anesthesia Evaluation  Patient identified by MRN, date of birth, ID band Patient awake    Reviewed: Allergy & Precautions, NPO status , Patient's Chart, lab work & pertinent test results  History of Anesthesia Complications Negative for: history of anesthetic complications  Airway Mallampati: II  TM Distance: >3 FB Neck ROM: Full    Dental  (+) Lower Dentures, Upper Dentures   Pulmonary former smoker (quit 10 years ago),    Pulmonary exam normal breath sounds clear to auscultation       Cardiovascular Exercise Tolerance: Good negative cardio ROS Normal cardiovascular exam Rhythm:Regular Rate:Normal     Neuro/Psych negative neurological ROS     GI/Hepatic negative GI ROS,   Endo/Other  diabetes, Type 2  Renal/GU negative Renal ROS     Musculoskeletal  (+) Fibromyalgia -  Abdominal   Peds  Hematology negative hematology ROS (+)   Anesthesia Other Findings   Reproductive/Obstetrics                             Anesthesia Physical Anesthesia Plan  ASA: III  Anesthesia Plan: MAC   Post-op Pain Management:    Induction: Intravenous  PONV Risk Score and Plan: 1 and Midazolam and TIVA  Airway Management Planned: Natural Airway  Additional Equipment:   Intra-op Plan:   Post-operative Plan:   Informed Consent: I have reviewed the patients History and Physical, chart, labs and discussed the procedure including the risks, benefits and alternatives for the proposed anesthesia with the patient or authorized representative who has indicated his/her understanding and acceptance.     Plan Discussed with: CRNA  Anesthesia Plan Comments:         Anesthesia Quick Evaluation

## 2018-02-02 NOTE — Transfer of Care (Signed)
Immediate Anesthesia Transfer of Care Note  Patient: Gerald COATE Sr.  Procedure(s) Performed: CATARACT EXTRACTION PHACO AND INTRAOCULAR LENS PLACEMENT (IOC) LEFT DIABETIC (Left Eye)  Patient Location: PACU  Anesthesia Type: MAC  Level of Consciousness: awake, alert  and patient cooperative  Airway and Oxygen Therapy: Patient Spontanous Breathing and Patient connected to supplemental oxygen  Post-op Assessment: Post-op Vital signs reviewed, Patient's Cardiovascular Status Stable, Respiratory Function Stable, Patent Airway and No signs of Nausea or vomiting  Post-op Vital Signs: Reviewed and stable  Complications: No apparent anesthesia complications

## 2018-02-02 NOTE — Anesthesia Postprocedure Evaluation (Signed)
Anesthesia Post Note  Patient: Gerald SCHWIEGER Sr.  Procedure(s) Performed: CATARACT EXTRACTION PHACO AND INTRAOCULAR LENS PLACEMENT (IOC) LEFT DIABETIC (Left Eye)  Patient location during evaluation: PACU Anesthesia Type: MAC Level of consciousness: awake and alert, oriented and patient cooperative Pain management: pain level controlled Vital Signs Assessment: post-procedure vital signs reviewed and stable Respiratory status: spontaneous breathing, nonlabored ventilation and respiratory function stable Cardiovascular status: blood pressure returned to baseline and stable Postop Assessment: adequate PO intake Anesthetic complications: no    Darrin Nipper

## 2018-02-02 NOTE — Anesthesia Procedure Notes (Signed)
Date/Time: 02/02/2018 7:47 AM Performed by: Cameron Ali, CRNA Pre-anesthesia Checklist: Patient identified, Emergency Drugs available, Suction available, Timeout performed and Patient being monitored Patient Re-evaluated:Patient Re-evaluated prior to induction Oxygen Delivery Method: Nasal cannula Placement Confirmation: positive ETCO2

## 2018-02-03 ENCOUNTER — Encounter: Payer: Self-pay | Admitting: Ophthalmology

## 2018-07-08 ENCOUNTER — Inpatient Hospital Stay
Admission: EM | Admit: 2018-07-08 | Discharge: 2018-07-09 | DRG: 247 | Disposition: A | Discharge status: Home / Self Care | Payer: Medicare Other | Attending: Specialist | Admitting: Specialist

## 2018-07-08 ENCOUNTER — Other Ambulatory Visit: Payer: Self-pay

## 2018-07-08 ENCOUNTER — Encounter
Admission: EM | Disposition: A | Discharge status: Home / Self Care | Payer: Self-pay | Source: Home / Self Care | Attending: Internal Medicine

## 2018-07-08 ENCOUNTER — Emergency Department: Payer: Medicare Other

## 2018-07-08 DIAGNOSIS — I252 Old myocardial infarction: Secondary | ICD-10-CM

## 2018-07-08 DIAGNOSIS — Z85828 Personal history of other malignant neoplasm of skin: Secondary | ICD-10-CM

## 2018-07-08 DIAGNOSIS — R079 Chest pain, unspecified: Secondary | ICD-10-CM | POA: Diagnosis present

## 2018-07-08 DIAGNOSIS — M797 Fibromyalgia: Secondary | ICD-10-CM | POA: Diagnosis present

## 2018-07-08 DIAGNOSIS — Z7984 Long term (current) use of oral hypoglycemic drugs: Secondary | ICD-10-CM | POA: Diagnosis not present

## 2018-07-08 DIAGNOSIS — I214 Non-ST elevation (NSTEMI) myocardial infarction: Secondary | ICD-10-CM | POA: Diagnosis present

## 2018-07-08 DIAGNOSIS — J449 Chronic obstructive pulmonary disease, unspecified: Secondary | ICD-10-CM | POA: Diagnosis present

## 2018-07-08 DIAGNOSIS — E118 Type 2 diabetes mellitus with unspecified complications: Secondary | ICD-10-CM | POA: Diagnosis not present

## 2018-07-08 DIAGNOSIS — I25110 Atherosclerotic heart disease of native coronary artery with unstable angina pectoris: Secondary | ICD-10-CM | POA: Diagnosis present

## 2018-07-08 DIAGNOSIS — K219 Gastro-esophageal reflux disease without esophagitis: Secondary | ICD-10-CM | POA: Diagnosis present

## 2018-07-08 DIAGNOSIS — I200 Unstable angina: Secondary | ICD-10-CM | POA: Diagnosis not present

## 2018-07-08 DIAGNOSIS — Z961 Presence of intraocular lens: Secondary | ICD-10-CM | POA: Diagnosis present

## 2018-07-08 DIAGNOSIS — Z7982 Long term (current) use of aspirin: Secondary | ICD-10-CM

## 2018-07-08 DIAGNOSIS — J432 Centrilobular emphysema: Secondary | ICD-10-CM | POA: Diagnosis not present

## 2018-07-08 DIAGNOSIS — E785 Hyperlipidemia, unspecified: Secondary | ICD-10-CM | POA: Diagnosis present

## 2018-07-08 DIAGNOSIS — E1142 Type 2 diabetes mellitus with diabetic polyneuropathy: Secondary | ICD-10-CM | POA: Diagnosis present

## 2018-07-08 DIAGNOSIS — Z9842 Cataract extraction status, left eye: Secondary | ICD-10-CM

## 2018-07-08 DIAGNOSIS — I472 Ventricular tachycardia: Secondary | ICD-10-CM | POA: Diagnosis not present

## 2018-07-08 DIAGNOSIS — I10 Essential (primary) hypertension: Secondary | ICD-10-CM | POA: Diagnosis present

## 2018-07-08 DIAGNOSIS — Z881 Allergy status to other antibiotic agents status: Secondary | ICD-10-CM | POA: Diagnosis not present

## 2018-07-08 DIAGNOSIS — R778 Other specified abnormalities of plasma proteins: Secondary | ICD-10-CM

## 2018-07-08 DIAGNOSIS — R7989 Other specified abnormal findings of blood chemistry: Secondary | ICD-10-CM

## 2018-07-08 DIAGNOSIS — F17290 Nicotine dependence, other tobacco product, uncomplicated: Secondary | ICD-10-CM | POA: Diagnosis present

## 2018-07-08 DIAGNOSIS — Z880 Allergy status to penicillin: Secondary | ICD-10-CM

## 2018-07-08 HISTORY — PX: LEFT HEART CATH AND CORONARY ANGIOGRAPHY: CATH118249

## 2018-07-08 HISTORY — PX: CORONARY STENT INTERVENTION: CATH118234

## 2018-07-08 LAB — CBC WITH DIFFERENTIAL/PLATELET
ABS IMMATURE GRANULOCYTES: 0.02 10*3/uL (ref 0.00–0.07)
BASOS PCT: 1 %
Basophils Absolute: 0.1 10*3/uL (ref 0.0–0.1)
Eosinophils Absolute: 0.1 10*3/uL (ref 0.0–0.5)
Eosinophils Relative: 1 %
HCT: 45.8 % (ref 39.0–52.0)
Hemoglobin: 15.7 g/dL (ref 13.0–17.0)
Immature Granulocytes: 0 %
LYMPHS PCT: 32 %
Lymphs Abs: 2.2 10*3/uL (ref 0.7–4.0)
MCH: 34.7 pg — ABNORMAL HIGH (ref 26.0–34.0)
MCHC: 34.3 g/dL (ref 30.0–36.0)
MCV: 101.1 fL — ABNORMAL HIGH (ref 80.0–100.0)
MONOS PCT: 6 %
Monocytes Absolute: 0.5 10*3/uL (ref 0.1–1.0)
NEUTROS ABS: 4.2 10*3/uL (ref 1.7–7.7)
Neutrophils Relative %: 60 %
PLATELETS: 182 10*3/uL (ref 150–400)
RBC: 4.53 MIL/uL (ref 4.22–5.81)
RDW: 13.2 % (ref 11.5–15.5)
WBC: 7.1 10*3/uL (ref 4.0–10.5)
nRBC: 0 % (ref 0.0–0.2)

## 2018-07-08 LAB — COMPREHENSIVE METABOLIC PANEL
ALT: 32 U/L (ref 0–44)
AST: 36 U/L (ref 15–41)
Albumin: 4.1 g/dL (ref 3.5–5.0)
Alkaline Phosphatase: 53 U/L (ref 38–126)
Anion gap: 12 (ref 5–15)
BUN: 14 mg/dL (ref 8–23)
CO2: 25 mmol/L (ref 22–32)
Calcium: 10.1 mg/dL (ref 8.9–10.3)
Chloride: 104 mmol/L (ref 98–111)
Creatinine, Ser: 1.04 mg/dL (ref 0.61–1.24)
GFR calc Af Amer: >60 mL/min (ref >60)
GFR calc non Af Amer: >60 mL/min (ref >60)
Glucose, Bld: 136 mg/dL — ABNORMAL HIGH (ref 70–99)
Potassium: 4.3 mmol/L (ref 3.5–5.1)
Sodium: 141 mmol/L (ref 135–145)
Total Bilirubin: 0.8 mg/dL (ref 0.3–1.2)
Total Protein: 7.4 g/dL (ref 6.5–8.1)

## 2018-07-08 LAB — PROTIME-INR
INR: 1.02
Prothrombin Time: 13.3 seconds (ref 11.4–15.2)

## 2018-07-08 LAB — GLUCOSE, CAPILLARY
Glucose-Capillary: 123 mg/dL — ABNORMAL HIGH (ref 70–99)
Glucose-Capillary: 86 mg/dL (ref 70–99)
Glucose-Capillary: 94 mg/dL (ref 70–99)

## 2018-07-08 LAB — APTT: aPTT: 28 seconds (ref 24–36)

## 2018-07-08 LAB — TROPONIN I
TROPONIN I: 18.66 ng/mL — AB (ref <0.03)
Troponin I: 0.29 ng/mL (ref <0.03)
Troponin I: 5.9 ng/mL (ref <0.03)

## 2018-07-08 LAB — POCT ACTIVATED CLOTTING TIME
ACTIVATED CLOTTING TIME: 329 s
Activated Clotting Time: 257 seconds

## 2018-07-08 SURGERY — LEFT HEART CATH AND CORONARY ANGIOGRAPHY
Anesthesia: Moderate Sedation | Laterality: Right

## 2018-07-08 MED ORDER — INSULIN ASPART 100 UNIT/ML ~~LOC~~ SOLN: 0.0000 [IU] | Freq: Three times a day (TID) | SUBCUTANEOUS | Status: DC

## 2018-07-08 MED ORDER — POLYETHYLENE GLYCOL 3350 17 G PO PACK: 17.0000 g | PACK | Freq: Every day | ORAL | Status: DC | PRN

## 2018-07-08 MED ORDER — FENTANYL CITRATE (PF) 100 MCG/2ML IJ SOLN
INTRAMUSCULAR | Status: DC | PRN
  Administered 2018-07-08: 25 ug via INTRAVENOUS

## 2018-07-08 MED ORDER — HEPARIN SODIUM (PORCINE) 1000 UNIT/ML IJ SOLN
INTRAMUSCULAR | Status: AC
  Filled 2018-07-08: qty 1

## 2018-07-08 MED ORDER — NITROGLYCERIN 0.4 MG SL SUBL
0.4000 mg | SUBLINGUAL_TABLET | SUBLINGUAL | Status: DC | PRN
  Administered 2018-07-08 (×2): 0.4 mg via SUBLINGUAL
  Filled 2018-07-08: qty 1

## 2018-07-08 MED ORDER — MORPHINE SULFATE (PF) 4 MG/ML IV SOLN
4.0000 mg | INTRAVENOUS | Status: DC | PRN
  Administered 2018-07-08 (×2): 2 mg via INTRAVENOUS
  Administered 2018-07-08: 4 mg via INTRAVENOUS
  Filled 2018-07-08: qty 1

## 2018-07-08 MED ORDER — ONDANSETRON HCL 4 MG/2ML IJ SOLN
4.0000 mg | Freq: Once | INTRAMUSCULAR | Status: AC
  Administered 2018-07-08: 4 mg via INTRAVENOUS
  Filled 2018-07-08: qty 2

## 2018-07-08 MED ORDER — TICAGRELOR 90 MG PO TABS
ORAL_TABLET | ORAL | Status: DC | PRN
  Administered 2018-07-08: 180 mg via ORAL

## 2018-07-08 MED ORDER — ASPIRIN 81 MG PO CHEW
CHEWABLE_TABLET | ORAL | Status: AC
  Filled 2018-07-08: qty 3

## 2018-07-08 MED ORDER — ADENOSINE 6 MG/2ML IV SOLN
INTRAVENOUS | Status: AC
  Filled 2018-07-08: qty 2

## 2018-07-08 MED ORDER — NITROGLYCERIN 5 MG/ML IV SOLN
INTRAVENOUS | Status: AC
  Filled 2018-07-08: qty 10

## 2018-07-08 MED ORDER — TICAGRELOR 90 MG PO TABS
ORAL_TABLET | ORAL | Status: AC
  Filled 2018-07-08: qty 2

## 2018-07-08 MED ORDER — ACETAMINOPHEN 650 MG RE SUPP: 650.0000 mg | Freq: Four times a day (QID) | RECTAL | Status: DC | PRN

## 2018-07-08 MED ORDER — ASPIRIN 81 MG PO CHEW: 81.0000 mg | CHEWABLE_TABLET | ORAL | Status: DC

## 2018-07-08 MED ORDER — ASPIRIN 81 MG PO TABS: 81.0000 mg | ORAL_TABLET | Freq: Every day | ORAL | Status: DC

## 2018-07-08 MED ORDER — HEPARIN (PORCINE) IN NACL 1000-0.9 UT/500ML-% IV SOLN
INTRAVENOUS | Status: AC
  Filled 2018-07-08: qty 1000

## 2018-07-08 MED ORDER — ATORVASTATIN CALCIUM 80 MG PO TABS
80.0000 mg | ORAL_TABLET | Freq: Every day | ORAL | Status: DC
  Administered 2018-07-08: 80 mg via ORAL
  Filled 2018-07-08 (×2): qty 1
  Filled 2018-07-08: qty 4

## 2018-07-08 MED ORDER — IOPAMIDOL (ISOVUE-370) INJECTION 76%
100.0000 mL | Freq: Once | INTRAVENOUS | Status: AC | PRN
  Administered 2018-07-08: 75 mL via INTRAVENOUS

## 2018-07-08 MED ORDER — NITROGLYCERIN 2 % TD OINT
1.0000 [in_us] | TOPICAL_OINTMENT | Freq: Once | TRANSDERMAL | Status: AC
  Administered 2018-07-08: 1 [in_us] via TOPICAL
  Filled 2018-07-08: qty 1

## 2018-07-08 MED ORDER — ADENOSINE (DIAGNOSTIC) FOR INTRACORONARY USE
INTRAVENOUS | Status: DC | PRN
  Administered 2018-07-08 (×3): 120 ug via INTRACORONARY

## 2018-07-08 MED ORDER — PANTOPRAZOLE SODIUM 40 MG PO TBEC
40.0000 mg | DELAYED_RELEASE_TABLET | Freq: Every day | ORAL | Status: DC
  Administered 2018-07-08 – 2018-07-09 (×2): 40 mg via ORAL
  Filled 2018-07-08 (×2): qty 1

## 2018-07-08 MED ORDER — NITROGLYCERIN 0.4 MG SL SUBL
SUBLINGUAL_TABLET | SUBLINGUAL | Status: AC
  Administered 2018-07-08: 0.4 mg via SUBLINGUAL
  Filled 2018-07-08: qty 1

## 2018-07-08 MED ORDER — ASPIRIN 81 MG PO CHEW
CHEWABLE_TABLET | ORAL | Status: DC | PRN
  Administered 2018-07-08: 324 mg via ORAL

## 2018-07-08 MED ORDER — MIDAZOLAM HCL 2 MG/2ML IJ SOLN
INTRAMUSCULAR | Status: AC
  Filled 2018-07-08: qty 2

## 2018-07-08 MED ORDER — MIDAZOLAM HCL 2 MG/2ML IJ SOLN
INTRAMUSCULAR | Status: DC | PRN
  Administered 2018-07-08: 1 mg via INTRAVENOUS

## 2018-07-08 MED ORDER — FENTANYL CITRATE (PF) 100 MCG/2ML IJ SOLN
INTRAMUSCULAR | Status: DC | PRN
  Administered 2018-07-08 (×2): 25 ug via INTRAVENOUS

## 2018-07-08 MED ORDER — MORPHINE SULFATE (PF) 2 MG/ML IV SOLN
INTRAVENOUS | Status: AC
  Filled 2018-07-08: qty 2

## 2018-07-08 MED ORDER — ENOXAPARIN SODIUM 40 MG/0.4ML ~~LOC~~ SOLN
40.0000 mg | SUBCUTANEOUS | Status: DC
  Administered 2018-07-08: 40 mg via SUBCUTANEOUS
  Filled 2018-07-08: qty 0.4

## 2018-07-08 MED ORDER — IOPAMIDOL (ISOVUE-300) INJECTION 61%
INTRAVENOUS | Status: DC | PRN
  Administered 2018-07-08: 100 mL via INTRA_ARTERIAL

## 2018-07-08 MED ORDER — ACETAMINOPHEN 325 MG PO TABS: 650.0000 mg | ORAL_TABLET | Freq: Four times a day (QID) | ORAL | Status: DC | PRN

## 2018-07-08 MED ORDER — ONDANSETRON HCL 4 MG/2ML IJ SOLN: 4.0000 mg | Freq: Four times a day (QID) | INTRAMUSCULAR | Status: DC | PRN

## 2018-07-08 MED ORDER — GLIMEPIRIDE 2 MG PO TABS
2.0000 mg | ORAL_TABLET | Freq: Every day | ORAL | Status: DC
  Administered 2018-07-09: 2 mg via ORAL
  Filled 2018-07-08: qty 1

## 2018-07-08 MED ORDER — HEPARIN (PORCINE) IN NACL 100-0.45 UNIT/ML-% IJ SOLN
1000.0000 [IU]/h | INTRAMUSCULAR | Status: DC
  Administered 2018-07-08: 1000 [IU]/h via INTRAVENOUS
  Filled 2018-07-08 (×2): qty 250

## 2018-07-08 MED ORDER — FENTANYL CITRATE (PF) 100 MCG/2ML IJ SOLN
INTRAMUSCULAR | Status: AC
  Filled 2018-07-08: qty 2

## 2018-07-08 MED ORDER — ACETAMINOPHEN 500 MG PO TABS: 1000.0000 mg | ORAL_TABLET | Freq: Four times a day (QID) | ORAL | Status: DC | PRN

## 2018-07-08 MED ORDER — HEPARIN SODIUM (PORCINE) 1000 UNIT/ML IJ SOLN
INTRAMUSCULAR | Status: DC | PRN
  Administered 2018-07-08: 6000 [IU] via INTRAVENOUS
  Administered 2018-07-08: 2000 [IU] via INTRAVENOUS

## 2018-07-08 MED ORDER — SODIUM CHLORIDE 0.9% FLUSH: 3.0000 mL | INTRAVENOUS | Status: DC | PRN

## 2018-07-08 MED ORDER — ONDANSETRON HCL 4 MG PO TABS: 4.0000 mg | ORAL_TABLET | Freq: Four times a day (QID) | ORAL | Status: DC | PRN

## 2018-07-08 MED ORDER — SODIUM CHLORIDE 0.9 % IV SOLN: 250.0000 mL | INTRAVENOUS | Status: DC | PRN

## 2018-07-08 MED ORDER — TICAGRELOR 90 MG PO TABS
90.0000 mg | ORAL_TABLET | Freq: Two times a day (BID) | ORAL | Status: DC
  Administered 2018-07-08 – 2018-07-09 (×2): 90 mg via ORAL
  Filled 2018-07-08 (×2): qty 1

## 2018-07-08 MED ORDER — SODIUM CHLORIDE 0.9 % WEIGHT BASED INFUSION
1.0000 mL/kg/h | INTRAVENOUS | Status: AC
  Administered 2018-07-08: 1 mL/kg/h via INTRAVENOUS

## 2018-07-08 MED ORDER — HEPARIN BOLUS VIA INFUSION
4000.0000 [IU] | Freq: Once | INTRAVENOUS | Status: AC
  Administered 2018-07-08: 4000 [IU] via INTRAVENOUS
  Filled 2018-07-08: qty 4000

## 2018-07-08 MED ORDER — SODIUM CHLORIDE 0.9 % WEIGHT BASED INFUSION: 1.0000 mL/kg/h | INTRAVENOUS | Status: DC

## 2018-07-08 MED ORDER — SODIUM CHLORIDE 0.9% FLUSH
3.0000 mL | Freq: Two times a day (BID) | INTRAVENOUS | Status: DC
  Administered 2018-07-09: 3 mL via INTRAVENOUS

## 2018-07-08 MED ORDER — SODIUM CHLORIDE 0.9 % WEIGHT BASED INFUSION
3.0000 mL/kg/h | INTRAVENOUS | Status: DC
  Administered 2018-07-08: 3 mL/kg/h via INTRAVENOUS

## 2018-07-08 MED ORDER — ASPIRIN EC 81 MG PO TBEC
81.0000 mg | DELAYED_RELEASE_TABLET | Freq: Every day | ORAL | Status: DC
  Administered 2018-07-09: 81 mg via ORAL
  Filled 2018-07-08: qty 1

## 2018-07-08 MED ORDER — NITROGLYCERIN 1 MG/10 ML FOR IR/CATH LAB
INTRA_ARTERIAL | Status: DC | PRN
  Administered 2018-07-08 (×2): 200 ug via INTRACORONARY

## 2018-07-08 SURGICAL SUPPLY — 16 items
BALLN MINITREK RX 2.0X20 (BALLOONS) ×4
BALLOON MINITREK RX 2.0X20 (BALLOONS) ×2 IMPLANT
CATH INFINITI 5FR ANG PIGTAIL (CATHETERS) ×4 IMPLANT
CATH INFINITI 5FR JL4 (CATHETERS) ×4 IMPLANT
CATH INFINITI JR4 5F (CATHETERS) ×4 IMPLANT
CATH VISTA GUIDE 6FR XB3.5 (CATHETERS) ×4 IMPLANT
DEVICE CLOSURE MYNXGRIP 6/7F (Vascular Products) ×4 IMPLANT
DEVICE INFLAT 30 PLUS (MISCELLANEOUS) ×4 IMPLANT
KIT MANI 3VAL PERCEP (MISCELLANEOUS) ×4 IMPLANT
NEEDLE PERC 18GX7CM (NEEDLE) ×4 IMPLANT
PACK CARDIAC CATH (CUSTOM PROCEDURE TRAY) ×4 IMPLANT
SHEATH AVANTI 5FR X 11CM (SHEATH) ×4 IMPLANT
SHEATH AVANTI 6FR X 11CM (SHEATH) ×4 IMPLANT
STENT RESOLUTE ONYX 2.0X22 (Permanent Stent) ×4 IMPLANT
WIRE GUIDERIGHT .035X150 (WIRE) ×4 IMPLANT
WIRE RUNTHROUGH .014X180CM (WIRE) ×4 IMPLANT

## 2018-07-08 NOTE — ED Notes (Signed)
Pt was taken to CT with RN, placed on zoll pads.  No acute distress.  Pt is back in room at this time.

## 2018-07-08 NOTE — H&P (Signed)
[facility] [place] NAME: Gerald Mcgrath    MR#:  [id number]  DATE OF BIRTH:  1941-02-28  DATE OF ADMISSION:  07/08/2018  PRIMARY CARE PHYSICIAN: Idelle Crouch, MD   REQUESTING/REFERRING PHYSICIAN: Dr. Quentin Cornwall  CHIEF COMPLAINT:  chest pain on and off for several days HISTORY OF PRESENT ILLNESS:  Gerald Mcgrath  is a 77 y.o. male with a known history of diabetes, fibromyalgia, hypertension, hyperlipidemia, history of skin cancer and a former smoker comes to the emergency room with complaints of mid substernal chest pain described as severe pressure like someone sitting on the chest it has been occurring and addressed over several days. EKG shows no acute changes how were given history of chest pain associated nausea and diaphoresis patient was seen by cardiology Dr. Rockey Situ and is recommending cardiac catheterization. Patient's chest pain was relieved with sublingual nitro.  He is being admitted with acute coronary syndrome. First set of cardiac enzymes .29.  PAST MEDICAL HISTORY:   Past Medical History:  Diagnosis Date  . Diabetes mellitus without complication ([place])   . Fibromyalgia   . Wrist pain    right. to see orthopedist next week    PAST SURGICAL HISTOIRY:   Past Surgical History:  Procedure Laterality Date  . CAROTID ENDARTERECTOMY Right   . CATARACT EXTRACTION W/PHACO Left 02/02/2018   Procedure: CATARACT EXTRACTION PHACO AND INTRAOCULAR LENS PLACEMENT ([facility]) LEFT DIABETIC;  Surgeon: Leandrew Koyanagi, MD;  Location: [place];  Service: Ophthalmology;  Laterality: Left;  DIABETIC  . COLONOSCOPY N/A 07/21/2016   Procedure: COLONOSCOPY;  Surgeon: Lollie Sails, MD;  Location: [facility] ENDOSCOPY;  Service: Endoscopy;  Laterality: N/A;  CBC; PT/ INR  . EYE SURGERY      SOCIAL HISTORY:   Social History   Tobacco Use  . Smoking status: Former Research scientist (life sciences)  . Smokeless tobacco: Current User  Substance Use  Topics  . Alcohol use: Not Currently    FAMILY HISTORY:  No family history on file.  DRUG ALLERGIES:   Allergies  Allergen Reactions  . Cephalexin Rash  . Penicillins Rash    Has patient had a PCN reaction causing immediate rash, facial/tongue/throat swelling, SOB or lightheadedness with hypotension: Unknown Has patient had a PCN reaction causing severe rash involving mucus membranes or skin necrosis: Unknown Has patient had a PCN reaction that required hospitalization: Unknown Has patient had a PCN reaction occurring within the last 10 years: Unknown If all of the above answers are "NO", then may proceed with Cephalosporin use.     REVIEW OF SYSTEMS:  Review of Systems  Constitutional: Negative for chills, fever and weight loss.  HENT: Negative for ear discharge, ear pain and nosebleeds.   Eyes: Negative for blurred vision, pain and discharge.  Respiratory: Positive for shortness of breath. Negative for sputum production, wheezing and stridor.   Cardiovascular: Positive for chest pain. Negative for palpitations, orthopnea and PND.  Gastrointestinal: Negative for abdominal pain, diarrhea, nausea and vomiting.  Genitourinary: Negative for frequency and urgency.  Musculoskeletal: Negative for back pain and joint pain.  Neurological: Negative for sensory change, speech change, focal weakness and weakness.  Psychiatric/Behavioral: Negative for depression and hallucinations. The patient is not nervous/anxious.      MEDICATIONS AT HOME:   Prior to Admission medications   Medication Sig Start Date End Date Taking? Authorizing Provider  acetaminophen (TYLENOL) 500 MG tablet Take 1,000 mg by mouth every 6 (six) hours as needed for mild  pain.    Yes [provider]  aspirin 81 MG tablet Take 81 mg by mouth daily.   Yes [provider]  atorvastatin (LIPITOR) 10 MG tablet Take 10 mg by mouth daily.   Yes [provider]  glimepiride (AMARYL) 2 MG tablet Take  2 mg by mouth daily with breakfast.   Yes [provider]  omeprazole (PRILOSEC) 20 MG capsule Take 20 mg by mouth daily.   Yes [provider]  predniSONE (DELTASONE) 20 MG tablet Take 2 tabs PO qd x 3 days. Patient not taking: Reported on 07/21/2016 04/27/16   Paulina Fusi, MD      VITAL SIGNS:  Blood pressure 113/78, pulse (!) 59, temperature 99.4 F (37.4 C), temperature source Oral, resp. rate 18, height 5\' 10"  (1.778 m), weight 74.8 kg, SpO2 98 %.  PHYSICAL EXAMINATION:  GENERAL:  77 y.o.-year-old patient lying in the bed with no acute distress.  EYES: Pupils equal, round, reactive to light and accommodation. No scleral icterus. Extraocular muscles intact.  HEENT: Head atraumatic, normocephalic. Oropharynx and nasopharynx clear.  NECK:  Supple, no jugular venous distention. No thyroid enlargement, no tenderness.  LUNGS: Normal breath sounds bilaterally, no wheezing, rales,rhonchi or crepitation. No use of accessory muscles of respiration.  CARDIOVASCULAR: S1, S2 normal. No murmurs, rubs, or gallops.  ABDOMEN: Soft, nontender, nondistended. Bowel sounds present. No organomegaly or mass.  EXTREMITIES: No pedal edema, cyanosis, or clubbing.  NEUROLOGIC: Cranial nerves II through XII are intact. Muscle strength 5/5 in all extremities. Sensation intact. Gait not checked.  PSYCHIATRIC: The patient is alert and oriented x 3.  SKIN: No obvious rash, lesion, or ulcer.   LABORATORY PANEL:   CBC Recent Labs  Lab 07/08/18 1029  WBC 7.1  HGB 15.7  HCT 45.8  PLT 182   ------------------------------------------------------------------------------------------------------------------  Chemistries  Recent Labs  Lab 07/08/18 1029  NA 141  K 4.3  CL 104  CO2 25  GLUCOSE 136*  BUN 14  CREATININE 1.04  CALCIUM 10.1  AST 36  ALT 32  ALKPHOS 53  BILITOT 0.8    ------------------------------------------------------------------------------------------------------------------  Cardiac Enzymes Recent Labs  Lab 07/08/18 1029  TROPONINI 0.29*   ------------------------------------------------------------------------------------------------------------------  RADIOLOGY:  Dg Chest Portable 1 View  Result Date: 07/08/2018 CLINICAL DATA:  Worsening shortness of breath and chest pain, initial encounter EXAM: PORTABLE CHEST 1 VIEW COMPARISON:  None. FINDINGS: The heart size and mediastinal contours are within normal limits. Both lungs are clear. The visualized skeletal structures are unremarkable. IMPRESSION: No active disease. Electronically Signed   By: Inez Catalina M.D.   On: 07/08/2018 10:55   Ct Angio Chest Aorta W And/or Wo Contrast  Result Date: 07/08/2018 CLINICAL DATA:  77 year old male with chest pain EXAM: CT ANGIOGRAPHY CHEST WITH CONTRAST TECHNIQUE: Multidetector CT imaging of the chest was performed using the standard protocol during bolus administration of intravenous contrast. Multiplanar CT image reconstructions and MIPs were obtained to evaluate the vascular anatomy. CONTRAST:  75mL ISOVUE-370 IOPAMIDOL (ISOVUE-370) INJECTION 76% COMPARISON:  06/06/2014 FINDINGS: Cardiovascular: Heart: No cardiomegaly. No pericardial fluid/thickening. No significant coronary calcifications. Aorta: Unremarkable course, caliber, contour of the thoracic aorta. No aneurysm or dissection flap. No periaortic fluid. Pulmonary arteries: The CT is not protocol for the most sensitive evaluation of the pulmonary arteries, however, there is no filling defect of the main pulmonary artery or the lobar pulmonary arteries. Mediastinum/Nodes: No mediastinal adenopathy. Unremarkable appearance of the thoracic esophagus. Unremarkable thoracic inlet Lungs/Pleura: Central airways are clear. No pleural  effusion. No confluent airspace disease. No pneumothorax. Upper Abdomen: Hiatal  hernia. No acute finding of the upper abdomen. Diffusely decreased attenuation of the liver parenchyma. Musculoskeletal: No acute displaced fracture. Degenerative changes of the spine. Review of the MIP images confirms the above findings. . IMPRESSION: Negative for acute aortic syndrome. No acute finding of the chest. Hiatal hernia. Electronically Signed   By: Corrie Mckusick D.O.   On: 07/08/2018 11:59    EKG:  sinus rhythm. No acute ST elevation or depression  IMPRESSION AND PLAN:   Nora Rooke  is a 77 y.o. male with a known history of diabetes, fibromyalgia, hypertension, hyperlipidemia, history of skin cancer and a former smoker comes to the emergency room with complaints of mid substernal chest pain described as severe pressure like someone sitting on the chest it has been occurring and addressed over several days.  1. acute coronary syndrome -admit to telemetry -patient presented with substernal pressure like chest pain with positive troponin and multiple risk factors. -Seen by Dr. Rockey Situ patient is plan to get cardiac cath today -continue aspirin PRN Nitro and follow further recommendations per cardiology  2. Type II diabetes continue home meds and sliding scale insulin  3. Hypertension resume home meds  4. DVT prophylaxis subcu Lovenox   All the records are reviewed and case discussed with ED provider. Management plans discussed with the patient, family and they are in agreement.  CODE STATUS: full  TOTAL TIME TAKING CARE OF THIS PATIENT: 55 minutes.    Fritzi Mandes M.D on 07/08/2018 at 1:45 PM  Between 7am to 6pm - Pager - [phone]  After 6pm go to [email or web address] - password [email or web address] [facility]  [facility]  Office  [phone]  CC: Primary care physician; Idelle Crouch, MD

## 2018-07-08 NOTE — Progress Notes (Signed)
ANTICOAGULATION CONSULT NOTE - Initial Consult  Pharmacy Consult for heparin Indication: chest pain/ACS  Allergies  Allergen Reactions  . Penicillins Rash    Patient Measurements: Height: 5\' 10"  (177.8 cm) Weight: 182 lb 15.7 oz (83 kg) IBW/kg (Calculated) : 73 Heparin Dosing Weight: 83 kg  Vital Signs: Temp: 98.3 F (36.8 C) (10/18 1030) BP: 141/73 (10/18 1130) Pulse Rate: 59 (10/18 1130)  Labs: Recent Labs    07/08/18 1029  HGB 15.7  HCT 45.8  PLT 182  CREATININE 1.04  TROPONINI 0.29*    Estimated Creatinine Clearance: 60.4 mL/min (by C-G formula based on SCr of 1.04 mg/dL).    Assessment: 77 yo male ordered to start heparin infusion No oral anticoag on PTA med list noted  Goal of Therapy:  Heparin level 0.3-0.7 units/ml Monitor platelets by anticoagulation protocol: Yes   Plan:  Add on aPTT and INR - aPTT 28, INR 1.02 Heparin bolus 4000 units IV x1 then heparin drip at 1000 units/hr (=10 ml/hr) Heparin level in 8h after start of drip CBC in AM  Pharmacy will continue to follow.    Rayna Sexton L 07/08/2018,12:00 PM

## 2018-07-08 NOTE — Progress Notes (Signed)
H&P Addendum, precardiac catheterization  Patient was seen and evaluated prior to Cardiac catheterization procedure Symptoms, prior testing details again confirmed with the patient Patient examined, no significant change from prior exam Lab work reviewed in detail personally by myself Patient understands risk and benefit of the procedure, willing to proceed  Signed, Tim Gollan, MD, Ph.D [facility]    

## 2018-07-08 NOTE — ED Provider Notes (Signed)
[facility] Emergency Department Provider Note    First MD Initiated Contact with Patient 07/08/18 1039     (approximate)  I have reviewed the triage vital signs and the nursing notes.   HISTORY  Chief Complaint Chest Pain    HPI Gerald NANNA Sr. is a 77 y.o. male the below listed past medical history history of atherosclerosis presents the ER for several days of stuttering midsternal chest pain became acutely worse earlier this morning.  Associated nausea and feeling that he needs to belch.  Thought his symptoms were reflux over the past several days.  Denies any fevers.  No cough.  States the pain is midsternal and pressure-like someone is sitting on his chest.  Denies any history of heart attack.  Does have a history of diabetes.   Past Medical History:  Diagnosis Date  . Diabetes mellitus without complication ([place])   . Fibromyalgia   . Wrist pain    right. to see orthopedist next week   No family history on file. Past Surgical History:  Procedure Laterality Date  . CAROTID ENDARTERECTOMY Right   . CATARACT EXTRACTION W/PHACO Left 02/02/2018   Procedure: CATARACT EXTRACTION PHACO AND INTRAOCULAR LENS PLACEMENT ([place]) LEFT DIABETIC;  Surgeon: Leandrew Koyanagi, MD;  Location: [place];  Service: Ophthalmology;  Laterality: Left;  DIABETIC  . COLONOSCOPY N/A 07/21/2016   Procedure: COLONOSCOPY;  Surgeon: Lollie Sails, MD;  Location: [facility] ENDOSCOPY;  Service: Endoscopy;  Laterality: N/A;  CBC; PT/ INR  . EYE SURGERY     Patient Active Problem List   Diagnosis Date Noted  . Chest pain 07/08/2018  . Non-ST elevation (NSTEMI) myocardial infarction [facility])       Prior to Admission medications   Medication Sig Start Date End Date Taking? Authorizing Provider  acetaminophen (TYLENOL) 500 MG tablet Take 1,000 mg by mouth every 6 (six) hours as needed for mild pain.    Yes [provider]  aspirin 81 MG tablet Take 81 mg  by mouth daily.   Yes [provider]  atorvastatin (LIPITOR) 10 MG tablet Take 10 mg by mouth daily.   Yes [provider]  glimepiride (AMARYL) 2 MG tablet Take 2 mg by mouth daily with breakfast.   Yes [provider]  omeprazole (PRILOSEC) 20 MG capsule Take 20 mg by mouth daily.   Yes [provider]  predniSONE (DELTASONE) 20 MG tablet Take 2 tabs PO qd x 3 days. Patient not taking: Reported on 07/21/2016 04/27/16   Paulina Fusi, MD    Allergies Cephalexin and Penicillins    Social History Social History   Tobacco Use  . Smoking status: Former Research scientist (life sciences)  . Smokeless tobacco: Current User  Substance Use Topics  . Alcohol use: Not Currently  . Drug use: No    Review of Systems Patient denies headaches, rhinorrhea, blurry vision, numbness, shortness of breath, chest pain, edema, cough, abdominal pain, nausea, vomiting, diarrhea, dysuria, fevers, rashes or hallucinations unless otherwise stated above in HPI. ____________________________________________   PHYSICAL EXAM:  VITAL SIGNS: Vitals:   07/08/18 1528 07/08/18 1536  BP: 130/71 128/69  Pulse:  63  Resp:  15  Temp:    SpO2:  99%    Constitutional: Alert and oriented.  Eyes: Conjunctivae are normal.  Head: Atraumatic. Nose: No congestion/rhinnorhea. Mouth/Throat: Mucous membranes are moist.   Neck: No stridor. Painless ROM.  Cardiovascular: Normal rate, regular rhythm. Grossly normal heart sounds.  Good peripheral circulation. Respiratory: Normal  respiratory effort.  No retractions. Lungs CTAB. Gastrointestinal: Soft and nontender. No distention. No abdominal bruits. No CVA tenderness. Genitourinary:  Musculoskeletal: No lower extremity tenderness nor edema.  No joint effusions. Neurologic:  Normal speech and language. No gross focal neurologic deficits are appreciated. No facial droop Skin:  Skin is warm, dry and intact. No rash noted. Psychiatric: Mood and affect are normal.  Speech and behavior are normal.  ____________________________________________   LABS (all labs ordered are listed, but only abnormal results are displayed)  Results for orders placed or performed during the hospital encounter of 07/08/18 (from the past 24 hour(s))  CBC with Differential/Platelet     Status: Abnormal   Collection Time: 07/08/18 10:29 AM  Result Value Ref Range   WBC 7.1 4.0 - 10.5 K/uL   RBC 4.53 4.22 - 5.81 MIL/uL   Hemoglobin 15.7 13.0 - 17.0 g/dL   HCT 45.8 39.0 - 52.0 %   MCV 101.1 (H) 80.0 - 100.0 fL   MCH 34.7 (H) 26.0 - 34.0 pg   MCHC 34.3 30.0 - 36.0 g/dL   RDW 13.2 11.5 - 15.5 %   Platelets 182 150 - 400 K/uL   nRBC 0.0 0.0 - 0.2 %   Neutrophils Relative % 60 %   Neutro Abs 4.2 1.7 - 7.7 K/uL   Lymphocytes Relative 32 %   Lymphs Abs 2.2 0.7 - 4.0 K/uL   Monocytes Relative 6 %   Monocytes Absolute 0.5 0.1 - 1.0 K/uL   Eosinophils Relative 1 %   Eosinophils Absolute 0.1 0.0 - 0.5 K/uL   Basophils Relative 1 %   Basophils Absolute 0.1 0.0 - 0.1 K/uL   Immature Granulocytes 0 %   Abs Immature Granulocytes 0.02 0.00 - 0.07 K/uL  Comprehensive metabolic panel     Status: Abnormal   Collection Time: 07/08/18 10:29 AM  Result Value Ref Range   Sodium 141 135 - 145 mmol/L   Potassium 4.3 3.5 - 5.1 mmol/L   Chloride 104 98 - 111 mmol/L   CO2 25 22 - 32 mmol/L   Glucose, Bld 136 (H) 70 - 99 mg/dL   BUN 14 8 - 23 mg/dL   Creatinine, Ser 1.04 0.61 - 1.24 mg/dL   Calcium 10.1 8.9 - 10.3 mg/dL   Total Protein 7.4 6.5 - 8.1 g/dL   Albumin 4.1 3.5 - 5.0 g/dL   AST 36 15 - 41 U/L   ALT 32 0 - 44 U/L   Alkaline Phosphatase 53 38 - 126 U/L   Total Bilirubin 0.8 0.3 - 1.2 mg/dL   GFR calc non Af Amer >60 >60 mL/min   GFR calc Af Amer >60 >60 mL/min   Anion gap 12 5 - 15  Troponin I     Status: Abnormal   Collection Time: 07/08/18 10:29 AM  Result Value Ref Range   Troponin I 0.29 (HH) <0.03 ng/mL  APTT     Status: None   Collection Time: 07/08/18 10:29 AM   Result Value Ref Range   aPTT 28 24 - 36 seconds  Protime-INR     Status: None   Collection Time: 07/08/18 10:29 AM  Result Value Ref Range   Prothrombin Time 13.3 11.4 - 15.2 seconds   INR 1.02   POCT Activated clotting time     Status: None   Collection Time: 07/08/18  2:46 PM  Result Value Ref Range   Activated Clotting Time 257 seconds  POCT Activated clotting time  Status: None   Collection Time: 07/08/18  3:05 PM  Result Value Ref Range   Activated Clotting Time 329 seconds   ____________________________________________  EKG My review and personal interpretation at Time: 10:26   Indication: chest pain  Rate: 60  Rhythm: sinus Axis: normal Other: normal intervals, flattening of st segment in V6, no stemi criteria  My review and personal interpretation at Time: 10:58  Indication: chest pain  Rate: 60  Rhythm: sinus Axis: normal Other: occasional pvc, no change from previous  ____________________________________________  RADIOLOGY  I personally reviewed all radiographic images ordered to evaluate for the above acute complaints and reviewed radiology reports and findings.  These findings were personally discussed with the patient.  Please see medical record for radiology report.  ____________________________________________   PROCEDURES  Procedure(s) performed:  .Critical Care Performed by: Merlyn Lot, MD Authorized by: Merlyn Lot, MD   Critical care provider statement:    Critical care time (minutes):  46   Critical care time was exclusive of:  Separately billable procedures and treating other patients   Critical care was necessary to treat or prevent imminent or life-threatening deterioration of the following conditions:  Cardiac failure   Critical care was time spent personally by me on the following activities:  Development of treatment plan with patient or surrogate, discussions with consultants, evaluation of patient's response to treatment,  examination of patient, obtaining history from patient or surrogate, ordering and performing treatments and interventions, ordering and review of laboratory studies, ordering and review of radiographic studies, pulse oximetry, re-evaluation of patient's condition and review of old charts      Critical Care performed: yes ____________________________________________   INITIAL IMPRESSION / [place] / ED COURSE  Pertinent labs & imaging results that were available during my care of the patient were reviewed by me and considered in my medical decision making (see chart for details).   DDX: ACS, pericarditis, esophagitis, boerhaaves, pe, dissection, pna, bronchitis, costochondritis   DONIVIN WIRT Sr. is a 77 y.o. who presents to the ED with symptoms as described above.  Patient is ill-appearing.  Vital signs are relatively stable mildly hypertensive.  Did have significant improvement in symptoms after nitroglycerin.  His abdominal exam is soft and benign.  No respiratory distress.  EKG was nonspecific changes and patient certainly high risk for coronary disease.  Does not seem clinically consistent with dissection.  Certainly concerning for unstable angina.  Patient received aspirin.  The patient will be placed on continuous pulse oximetry and telemetry for monitoring.  Laboratory evaluation will be sent to evaluate for the above complaints.     Clinical Course as of Jul 09 1543  Fri Jul 08, 2018  1115 Patient complaining of worsening chest pain repeat EKG shows no dynamic changes.  Still no evidence of STEMI.  Clinically appears to be having anginal symptoms.  Will order CT angiogram to evaluate for dissection or aortic pathology.   [PR]  0093 CT angiogram does not show any evidence of dissection.  Troponin elevated 0.29 concerning for an STEMI.  We will continue cardiac monitoring.  Will consult cardiology.  Will initiate heparinization.   [PR]  1200 Patient's pain is improving  at this time.  Discussed case with Dr. Rockey Situ he is going to evaluate patient at bedside for possible early or urgent PCI.   [PR]    Clinical Course User Index [PR] Merlyn Lot, MD     As part of my medical decision making, I reviewed the  following data within the [place] notes reviewed and incorporated, Labs reviewed, notes from prior ED visits and [place] Controlled Substance Database   ____________________________________________   FINAL CLINICAL IMPRESSION(S) / ED DIAGNOSES  Final diagnoses:  Chest pain, unspecified type  Elevated troponin I level      NEW MEDICATIONS STARTED DURING THIS VISIT:  Current Discharge Medication List       Note:  This document was prepared using Dragon voice recognition software and may include unintentional dictation errors.    Merlyn Lot, MD 07/08/18 [phone]

## 2018-07-08 NOTE — ED Triage Notes (Signed)
Pt arrives from home via [facility] c/o non radiating, non reproducible CP "radiates horizontally across chest." positive for nausea, diaphoresis. BP 190/80 PTA per EMS, gave 2 sublingual nitro pt states eased pain from 10 to 5. Former smoker, high cholesterol, diabetes, protocol initiated.

## 2018-07-08 NOTE — H&P (Signed)
Cardiology Admission History and Physical:   Patient ID: Gerald Frisk Sr. MRN: [id number]; DOB: 12-May-1941   Admission date: 07/08/2018  Primary Care Provider: Idelle Crouch, MD Primary Cardiologist: [facility] Dr. Rockey Situ Primary Electrophysiologist:  None   Chief Complaint:  ACS / Chest pain   Patient Profile:   Gerald SOUTHWELL Sr. is a 77 y.o. 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 Quentin Cornwall.  History of Present Illness:   Gerald Mcgrath is a 77 yo male with PMH as above. He is also s/p R carotid endarterectomy. Followed by Dr. Doy Hutching ([place]). PTA medications listed below and noted to include ASA 62m, Lipitor 143mdaily, and PPI.  LDL 153 (10/13/2017). Total Cholesterol 246 (10/13/2017) 2016 TTE as below in CV studies, performed d/t DOE and shows normal EF (~55%) with mild LAE, RAE, G1DD (CareEverywhere).  On 10/18, he reported from home via EMS with c/o CP mid-sternal chest pain, described as "severe pressure, like someone sitting on my chest," and occurring at rest and over the last several days. The CP was further described as radiating horizontally across the chest with associated nausea and diaphoresis. He reportedly initially had thought the pain was d/t GERD; however, when the pain worsened the morning of 10/18, he decided to come into the ED. As below, relieving factors included SL nitro, nitro past, and oxygen.  In the ED 10/18, Vitals: BP 143/78, HR 57, RR 21, SpO2 95%  Labs: Na 141, K 4.3, glucose 136, SCR 1.04, CA 10.1, alk phos 53, AST 36, ALT 32, WBC 7.1, Hgb 15.7, HCT 45.8, platelets 182 Troponin: 0.29 EKG: SR, 62 bpm, ST changes noted in lateral leads CXR: Negative for active cardiopulmonary  disease Meds: EMS gave 2 SL nitro with CP improving from 10 to 5. On 2L [place] with further relief. Zoll pads placed. Nitro paste administered while in the room. Started on heparin per ACS protocol.   Patient seen by Cardiology in the ED.  Discussed further ischemic evaluation options with patient and evaluation by stress test vs cardiac cath.  Plan for cardiac catheterization with risks and benefits of the procedure discussed with the patient and two family members present.  Past Medical History:  Diagnosis Date  . [place] complication ([place]  . Fibromyalgia   . Wrist pain    right. to see orthopedist next week    Past Surgical History:  Procedure Laterality Date  . CAROTID ENDARTERECTOMY Right   . CATARACT EXTRACTION W/PHACO Left 02/02/2018   Procedure: CATARACT EXTRACTION PHACO AND [place]TRAOCULAR LENS PLACEMENT [place];  Surgeon: BrLeandrew KoyanagiMD;  [place] Service: Ophthalmology;  Laterality: Left;  DIABETIC  . COLONOSCOPY N/A 07/21/2016   Procedure: COLONOSCOPY;  Surgeon: MaLollie SailsMD;  [facility];  Service: Endoscopy;  Laterality: N/A;  CBC; PT/ INR  . EYE SURGERY       Medications Prior to Admission: Prior to Admission medications   Medication Sig Start Date End Date Taking? Authorizing Provider  acetaminophen (TYLENOL) 500 MG tablet Take 1,000 mg by mouth every 6 (six) hours as needed.    [provider]  aspirin 81 MG tablet Take 81 mg by mouth daily.  [provider]  atorvastatin (LIPITOR) 10 MG tablet Take 10 mg by mouth daily.    [provider]  glimepiride (AMARYL) 2 MG tablet Take 2 mg by mouth daily with breakfast.    [provider]  omeprazole (PRILOSEC) 20 MG capsule Take 20 mg by mouth daily.    [provider]  predniSONE (DELTASONE) 20 MG tablet Take 2 tabs PO qd x 3 days. Patient not taking: Reported on 07/21/2016 04/27/16   Gerald Fusi,  MD     Allergies:    Allergies  Allergen Reactions  . Penicillins Rash    Social History:   Social History   Socioeconomic History  . Marital status: Married    Spouse name: Not on file  . Number of children: Not on file  . Years of education: Not on file  . Highest education level: Not on file  Occupational History  . Not on file  Social Needs  . Financial resource strain: Not on file  . Food insecurity:    Worry: Not on file    Inability: Not on file  . Transportation needs:    Medical: Not on file    Non-medical: Not on file  Tobacco Use  . Smoking status: Former Research scientist (life sciences)  . Smokeless tobacco: Current User  Substance and Sexual Activity  . Alcohol use: Not Currently  . Drug use: No  . Sexual activity: Not on file  Lifestyle  . Physical activity:    Days per week: Not on file    Minutes per session: Not on file  . Stress: Not on file  Relationships  . Social connections:    Talks on phone: Not on file    Gets together: Not on file    Attends religious service: Not on file    Active member of club or organization: Not on file    Attends meetings of clubs or organizations: Not on file    Relationship status: Not on file  . Intimate partner violence:    Fear of current or ex partner: Not on file    Emotionally abused: Not on file    Physically abused: Not on file    Forced sexual activity: Not on file  Other Topics Concern  . Not on file  Social History Narrative  . Not on file    Family History:   The patient's family history is not on file.    ROS:  Please see the history of present illness.  All other ROS reviewed and negative.     Physical Exam/Data:   Vitals:   07/08/18 1100 07/08/18 1124 07/08/18 1130 07/08/18 1200  BP: 138/73  (!) 141/73 (!) 144/76  Pulse: (!) 56  (!) 59 (!) 57  Resp: (!) 21  19 14   Temp:      SpO2: 97%  95% 99%  Weight:  83 kg    Height:       No intake or output data in the 24 hours ending 07/08/18 1231 Filed Weights    07/08/18 1040 07/08/18 1124  Weight: 83 kg 83 kg   Body mass index is 26.26 kg/m.  General:  Well nourished, well developed, in no acute distress HEENT: normal Lymph: no adenopathy Neck: no JVD Endocrine:  No thryomegaly Vascular: Strong bilateral radial pulse  Cardiac:  normal S1, S2; RRR; no murmur   Lungs:  clear to auscultation bilaterally, no wheezing, rhonchi or rales  Abd: soft, nontender, no hepatomegaly  Ext: no LE edema Musculoskeletal:  No deformities, BUE and BLE strength normal and equal Skin: warm and dry  Neuro:  no focal abnormalities noted Psych:  Normal affect    EKG:  As above  Relevant CV Studies: 03/04/2015 TTE INTERPRETATION NORMAL LEFT VENTRICULAR SYSTOLIC FUNCTION NORMAL RIGHT VENTRICULAR SYSTOLIC FUNCTION MILD VALVULAR REGURGITATION NO VALVULAR STENOSIS  ECHOCARDIOGRAPHIC DESCRIPTIONS AORTIC ROOT Size:Normal Dissection:INDETERM FOR DISSECTION AORTIC VALVE Leaflets:Tricuspid Morphology:Normal Mobility:Fully mobile LEFT VENTRICLE Size:NormalAnterior:Normal  Contraction:Normal Lateral:Normal Closest EF:>55% (Estimated)Septal:Normal  LV Masses:No Masses Apical:Normal  KZS:WFUX Inferior:Normal   Posterior:Normal Dias.FxClass:(Grade 1) relaxation abnormal, E/A reversal MITRAL VALVE Leaflets:NormalMobility:Fully mobile Morphology:Normal LEFT ATRIUM Size:MILDLY ENLARGEDLA Masses:No masses  IA Septum:Normal IAS MAIN PA Size:Normal PULMONIC VALVE Morphology:NormalMobility:Fully mobile RIGHT VENTRICLE  RV Masses:No Masses Size:Normal  Free Wall:Normal Contraction:Normal TRICUSPID VALVE Leaflets:NormalMobility:Fully mobile Morphology:Normal RIGHT  ATRIUM Size:MILDLY ENLARGED RA Other:None  RA Mass:No masses PERICARDIUM  Fluid:No effusion INFERIOR VENACAVA Size:Normal Normal respiratory collapse   Laboratory Data:  Chemistry Recent Labs  Lab 07/08/18 1029  NA 141  K 4.3  CL 104  CO2 25  GLUCOSE 136*  BUN 14  CREATININE 1.04  CALCIUM 10.1  GFRNONAA >60  GFRAA >60  ANIONGAP 12    Recent Labs  Lab 07/08/18 1029  PROT 7.4  ALBUMIN 4.1  AST 36  ALT 32  ALKPHOS 53  BILITOT 0.8   Hematology Recent Labs  Lab 07/08/18 1029  WBC 7.1  RBC 4.53  HGB 15.7  HCT 45.8  MCV 101.1*  MCH 34.7*  MCHC 34.3  RDW 13.2  PLT 182   Cardiac Enzymes Recent Labs  Lab 07/08/18 1029  TROPONINI 0.29*   No results for input(s): TROPIPOC in the last 168 hours.  BNPNo results for input(s): BNP, PROBNP in the last 168 hours.  DDimer No results for input(s): DDIMER in the last 168 hours.  Radiology/Studies:  Dg Chest Portable 1 View  Result Date: 07/08/2018 CLINICAL DATA:  Worsening shortness of breath and chest pain, initial encounter EXAM: PORTABLE CHEST 1 VIEW COMPARISON:  None. FINDINGS: The heart size and mediastinal contours are within normal limits. Both lungs are clear. The visualized skeletal structures are unremarkable. IMPRESSION: No active disease. Electronically Signed   By: Inez Catalina M.D.   On: 07/08/2018 10:55   Ct Angio Chest Aorta W And/or Wo Contrast  Result Date: 07/08/2018 CLINICAL DATA:  77 year old male with chest pain EXAM: CT ANGIOGRAPHY CHEST WITH CONTRAST TECHNIQUE: Multidetector CT imaging of the chest was performed using the standard protocol during bolus administration of intravenous contrast. Multiplanar CT image reconstructions and MIPs were obtained to evaluate the vascular anatomy. CONTRAST:  72m ISOVUE-370 IOPAMIDOL (ISOVUE-370) INJECTION 76% COMPARISON:  06/06/2014 FINDINGS: Cardiovascular: Heart: No cardiomegaly. No pericardial fluid/thickening. No  significant coronary calcifications. Aorta: Unremarkable course, caliber, contour of the thoracic aorta. No aneurysm or dissection flap. No periaortic fluid. Pulmonary arteries: The CT is not protocol for the most sensitive evaluation of the pulmonary arteries, however, there is no filling defect of the main pulmonary artery or the lobar pulmonary arteries. Mediastinum/Nodes: No mediastinal adenopathy. Unremarkable appearance of the thoracic esophagus. Unremarkable thoracic inlet Lungs/Pleura: Central airways are clear. No pleural effusion. No confluent airspace disease. No pneumothorax. Upper Abdomen: Hiatal hernia. No acute finding of the upper abdomen. Diffusely decreased attenuation of the liver parenchyma. Musculoskeletal: No acute displaced fracture. Degenerative changes of the spine. Review of the MIP images confirms the above findings. . IMPRESSION: Negative for acute aortic syndrome. No acute finding of the chest. Hiatal hernia. Electronically Signed   By: JYork Cerise  Earleen Newport D.O.   On: 07/08/2018 11:59    Assessment and Plan:   NSTEMI - Chest pressure / UA with elevated troponin and relief with SL nitro / paste and oxygen - EKG as above - Troponin elevated - Continue heparin - Hold diabetic medications - Cr 1.04. Monitor. - K 4.3, Hgb 15.7 - Plan for cardiac cath today.  - The procedure with Risks/Benefits/Alternatives and Indications was reviewed & all questions were answered.  Risks / Complications include, but not limited to: Death, MI, CVA/TIA, VF/VT (with defibrillation), Bradycardia (need for temporary pacer placement), contrast induced nephropathy, bleeding / bruising / hematoma / pseudoaneurysm, radiation-related injury in the case of prolonged fluoroscopy use, vascular or coronary injury (with possible emergent CT or Vascular Surgery), adverse medication reactions, infection. The patient understands the risks of serious complication is 1-2 in 4540 with diagnostic cardiac cath and 1-2% or  less with angioplasty/stenting. The patient (and family) voice understanding and agree to proceed.    DM2 - Holding medications  Remainder per IM at this time  Arvil Chaco, PA-C  07/08/2018  1:21 PM          Severity of Illness: The appropriate patient status for this patient is INPATIENT. Inpatient status is judged to be reasonable and necessary in order to provide the required intensity of service to ensure the patient's safety. The patient's presenting symptoms, physical exam findings, and initial radiographic and laboratory data in the context of their chronic comorbidities is felt to place them at high risk for further clinical deterioration. Furthermore, it is not anticipated that the patient will be medically stable for discharge from the hospital within 2 midnights of admission. The following factors support the patient status of inpatient.   " The patient's presenting symptoms include ACS chest pain at rest, elevated troponin " The worrisome physical exam findings include ACS, elevated troponin.  " The initial radiographic and laboratory data are worrisome because of ACS. " The chronic co-morbidities include HTN, HLD, prior smoker.   * I certify that at the point of admission it is my clinical judgment that the patient will require inpatient hospital care spanning beyond 2 midnights from the point of admission due to high intensity of service, high risk for further deterioration and high frequency of surveillance required.*    For questions or updates, please contact [place] Please consult [email or web address] for contact info under        Signed, Arvil Chaco, PA-C  07/08/2018 12:31 PM

## 2018-07-08 NOTE — ED Notes (Signed)
[place], RN cosigned heparin

## 2018-07-08 NOTE — ED Notes (Signed)
Pt placed on 2L nasal cannula, states "helps relieves the CP"

## 2018-07-08 NOTE — Progress Notes (Signed)
Family Meeting Note patient came in with increasing chest pain and shortness of breath for several days gotten worse today felt like chest pressure. He is being admitted for acute coronary syndrome rule out. Troponin of .29. Risk factors with diabetes and hypertension. A dress code status patient wants to be a full code. Family members in the ER. Overall hemodynamically stable. Prognosis is stable.  Total Time spent 16 minutes  Fritzi Mandes, MD

## 2018-07-09 LAB — CBC
HEMATOCRIT: 38.8 % — AB (ref 39.0–52.0)
Hemoglobin: 13.1 g/dL (ref 13.0–17.0)
MCH: 34.7 pg — ABNORMAL HIGH (ref 26.0–34.0)
MCHC: 33.8 g/dL (ref 30.0–36.0)
MCV: 102.9 fL — ABNORMAL HIGH (ref 80.0–100.0)
NRBC: 0 % (ref 0.0–0.2)
PLATELETS: 147 10*3/uL — AB (ref 150–400)
RBC: 3.77 MIL/uL — ABNORMAL LOW (ref 4.22–5.81)
RDW: 13.3 % (ref 11.5–15.5)
WBC: 7.3 10*3/uL (ref 4.0–10.5)

## 2018-07-09 LAB — BASIC METABOLIC PANEL
ANION GAP: 8 (ref 5–15)
BUN: 17 mg/dL (ref 8–23)
CO2: 28 mmol/L (ref 22–32)
CREATININE: 1.23 mg/dL (ref 0.61–1.24)
Calcium: 8.8 mg/dL — ABNORMAL LOW (ref 8.9–10.3)
Chloride: 101 mmol/L (ref 98–111)
GFR, EST NON AFRICAN AMERICAN: 54 mL/min — AB (ref >60)
Glucose, Bld: 117 mg/dL — ABNORMAL HIGH (ref 70–99)
Potassium: 4.6 mmol/L (ref 3.5–5.1)
SODIUM: 137 mmol/L (ref 135–145)

## 2018-07-09 LAB — TROPONIN I: TROPONIN I: 26.57 ng/mL — AB (ref <0.03)

## 2018-07-09 LAB — GLUCOSE, CAPILLARY
GLUCOSE-CAPILLARY: 121 mg/dL — AB (ref 70–99)
Glucose-Capillary: 105 mg/dL — ABNORMAL HIGH (ref 70–99)

## 2018-07-09 MED ORDER — ATORVASTATIN CALCIUM 80 MG PO TABS: 80.0000 mg | ORAL_TABLET | Freq: Every day | ORAL | 1 refills | Status: DC

## 2018-07-09 MED ORDER — TICAGRELOR 90 MG PO TABS: 90.0000 mg | ORAL_TABLET | Freq: Two times a day (BID) | ORAL | 1 refills | Status: DC

## 2018-07-09 MED ORDER — INFLUENZA VAC SPLIT HIGH-DOSE 0.5 ML IM SUSY: 0.5000 mL | PREFILLED_SYRINGE | INTRAMUSCULAR | Status: DC

## 2018-07-09 MED ORDER — CARVEDILOL 3.125 MG PO TABS: 3.1250 mg | ORAL_TABLET | Freq: Two times a day (BID) | ORAL | Status: DC

## 2018-07-09 MED ORDER — CARVEDILOL 3.125 MG PO TABS: 3.1250 mg | ORAL_TABLET | Freq: Two times a day (BID) | ORAL | 1 refills | Status: DC

## 2018-07-09 MED ORDER — PNEUMOCOCCAL VAC POLYVALENT 25 MCG/0.5ML IJ INJ: 0.5000 mL | INJECTION | INTRAMUSCULAR | Status: DC

## 2018-07-09 NOTE — Discharge Summary (Signed)
[place] [facility] [place] NAME: Gerald Mcgrath    MR#:  [id number]  DATE OF BIRTH:  10-Jul-1941  DATE OF ADMISSION:  07/08/2018 ADMITTING PHYSICIAN: Fritzi Mandes, MD  DATE OF DISCHARGE: 07/09/2018  2:30 PM  PRIMARY CARE PHYSICIAN: Idelle Crouch, MD    ADMISSION DIAGNOSIS:  Elevated troponin I level [R79.89] Chest pain, unspecified type [R07.9]  DISCHARGE DIAGNOSIS:  Active Problems:   Chest pain   Non-ST elevation (NSTEMI) myocardial infarction [facility])   SECONDARY DIAGNOSIS:   Past Medical History:  Diagnosis Date  . Diabetes mellitus without complication ([place])   . Fibromyalgia   . Wrist pain    right. to see orthopedist next week    HOSPITAL COURSE:   77 year old male with past medical history of diabetes, fibromyalgia, GERD who presented to the hospital due to chest pain.  1.  Non-ST elevation MI-this was the cause of patient's chest pain.  Patient presented to the hospital and ruled in by his cardiac markers which is troponins going as high as over 20. - Patient was seen by cardiology and underwent cardiac catheterization with successful angioplasty and drug-eluting stent placement to the ramus intermedius.  Post cardiac catheterization patient is chest pain-free and hemodynamically stable.  He was ambulated and had no acute symptoms. - He is being discharged on aspirin, Brilinta, atorvastatin and low-dose carvedilol. -She will follow-up with cardiology next week to 2 weeks.  2.  History of diabetes-patient will resume his glimepiride.  3.  GERD- patient will continue his omeprazole.  4.  Hyperlipidemia- patient's atorvastatin dose was increased to 80 mg given his non-ST elevation MI as mentioned above.  DISCHARGE CONDITIONS:   Stable.   CONSULTS OBTAINED:  Treatment Team:  Minna Merritts, MD  DRUG ALLERGIES:   Allergies  Allergen Reactions  . Cephalexin Rash  . Penicillins Rash    Has patient had a PCN reaction  causing immediate rash, facial/tongue/throat swelling, SOB or lightheadedness with hypotension: Unknown Has patient had a PCN reaction causing severe rash involving mucus membranes or skin necrosis: Unknown Has patient had a PCN reaction that required hospitalization: Unknown Has patient had a PCN reaction occurring within the last 10 years: Unknown If all of the above answers are "NO", then may proceed with Cephalosporin use.     DISCHARGE MEDICATIONS:   Allergies as of 07/09/2018      Reactions   Cephalexin Rash   Penicillins Rash   Has patient had a PCN reaction causing immediate rash, facial/tongue/throat swelling, SOB or lightheadedness with hypotension: Unknown Has patient had a PCN reaction causing severe rash involving mucus membranes or skin necrosis: Unknown Has patient had a PCN reaction that required hospitalization: Unknown Has patient had a PCN reaction occurring within the last 10 years: Unknown If all of the above answers are "NO", then may proceed with Cephalosporin use.      Medication List    STOP taking these medications   predniSONE 20 MG tablet Commonly known as:  DELTASONE     TAKE these medications   acetaminophen 500 MG tablet Commonly known as:  TYLENOL Take 1,000 mg by mouth every 6 (six) hours as needed for mild pain.   aspirin 81 MG tablet Take 81 mg by mouth daily.   atorvastatin 80 MG tablet Commonly known as:  LIPITOR Take 1 tablet (80 mg total) by mouth daily. What changed:    medication strength  how much to take   carvedilol 3.125 MG  tablet Commonly known as:  COREG Take 1 tablet (3.125 mg total) by mouth 2 (two) times daily with a meal.   glimepiride 2 MG tablet Commonly known as:  AMARYL Take 2 mg by mouth daily with breakfast.   omeprazole 20 MG capsule Commonly known as:  PRILOSEC Take 20 mg by mouth daily.   ticagrelor 90 MG Tabs tablet Commonly known as:  BRILINTA Take 1 tablet (90 mg total) by mouth 2 (two) times  daily.         DISCHARGE INSTRUCTIONS:   DIET:  Cardiac diet and Diabetic diet  DISCHARGE CONDITION:  Stable  ACTIVITY:  Activity as tolerated  OXYGEN:  Home Oxygen: No.   Oxygen Delivery: room air  DISCHARGE LOCATION:  home   If you experience worsening of your admission symptoms, develop shortness of breath, life threatening emergency, suicidal or homicidal thoughts you must seek medical attention immediately by calling 911 or calling your MD immediately  if symptoms less severe.  You Must read complete instructions/literature along with all the possible adverse reactions/side effects for all the Medicines you take and that have been prescribed to you. Take any new Medicines after you have completely understood and accpet all the possible adverse reactions/side effects.   Please note  You were cared for by a hospitalist during your hospital stay. If you have any questions about your discharge medications or the care you received while you were in the hospital after you are discharged, you can call the unit and asked to speak with the hospitalist on call if the hospitalist that took care of you is not available. Once you are discharged, your primary care physician will handle any further medical issues. Please note that NO REFILLS for any discharge medications will be authorized once you are discharged, as it is imperative that you return to your primary care physician (or establish a relationship with a primary care physician if you do not have one) for your aftercare needs so that they can reassess your need for medications and monitor your lab values.     Today   Currently chest pain-free and hemodynamically stable.  Patient will be discharged home later today with outpatient cardiology follow-up.  VITAL SIGNS:  Blood pressure 131/64, pulse (!) 54, temperature 98.3 F (36.8 C), temperature source Oral, resp. rate 18, height 5\' 10"  (1.778 m), weight 74.8 kg, SpO2 95  %.  I/O:    Intake/Output Summary (Last 24 hours) at 07/09/2018 1646 Last data filed at 07/09/2018 0500 Gross per 24 hour  Intake 400 ml  Output 800 ml  Net -400 ml    PHYSICAL EXAMINATION:  GENERAL:  77 y.o.-year-old patient lying in the bed with no acute distress.  EYES: Pupils equal, round, reactive to light and accommodation. No scleral icterus. Extraocular muscles intact.  HEENT: Head atraumatic, normocephalic. Oropharynx and nasopharynx clear.  NECK:  Supple, no jugular venous distention. No thyroid enlargement, no tenderness.  LUNGS: Normal breath sounds bilaterally, no wheezing, rales,rhonchi. No use of accessory muscles of respiration.  CARDIOVASCULAR: S1, S2 normal. No murmurs, rubs, or gallops.  ABDOMEN: Soft, non-tender, non-distended. Bowel sounds present. No organomegaly or mass.  EXTREMITIES: No pedal edema, cyanosis, or clubbing.  NEUROLOGIC: Cranial nerves II through XII are intact. No focal motor or sensory defecits b/l.  PSYCHIATRIC: The patient is alert and oriented x 3.  SKIN: No obvious rash, lesion, or ulcer.   DATA REVIEW:   CBC Recent Labs  Lab 07/09/18 0435  WBC 7.3  HGB 13.1  HCT 38.8*  PLT 147*    Chemistries  Recent Labs  Lab 07/08/18 1029 07/09/18 0435  NA 141 137  K 4.3 4.6  CL 104 101  CO2 25 28  GLUCOSE 136* 117*  BUN 14 17  CREATININE 1.04 1.23  CALCIUM 10.1 8.8*  AST 36  --   ALT 32  --   ALKPHOS 53  --   BILITOT 0.8  --     Cardiac Enzymes Recent Labs  Lab 07/09/18 0435  TROPONINI 26.57*    Microbiology Results  No results found for this or any previous visit.  RADIOLOGY:  Dg Chest Portable 1 View  Result Date: 07/08/2018 CLINICAL DATA:  Worsening shortness of breath and chest pain, initial encounter EXAM: PORTABLE CHEST 1 VIEW COMPARISON:  None. FINDINGS: The heart size and mediastinal contours are within normal limits. Both lungs are clear. The visualized skeletal structures are unremarkable. IMPRESSION: No  active disease. Electronically Signed   By: Inez Catalina M.D.   On: 07/08/2018 10:55   Ct Angio Chest Aorta W And/or Wo Contrast  Result Date: 07/08/2018 CLINICAL DATA:  77 year old male with chest pain EXAM: CT ANGIOGRAPHY CHEST WITH CONTRAST TECHNIQUE: Multidetector CT imaging of the chest was performed using the standard protocol during bolus administration of intravenous contrast. Multiplanar CT image reconstructions and MIPs were obtained to evaluate the vascular anatomy. CONTRAST:  76mL ISOVUE-370 IOPAMIDOL (ISOVUE-370) INJECTION 76% COMPARISON:  06/06/2014 FINDINGS: Cardiovascular: Heart: No cardiomegaly. No pericardial fluid/thickening. No significant coronary calcifications. Aorta: Unremarkable course, caliber, contour of the thoracic aorta. No aneurysm or dissection flap. No periaortic fluid. Pulmonary arteries: The CT is not protocol for the most sensitive evaluation of the pulmonary arteries, however, there is no filling defect of the main pulmonary artery or the lobar pulmonary arteries. Mediastinum/Nodes: No mediastinal adenopathy. Unremarkable appearance of the thoracic esophagus. Unremarkable thoracic inlet Lungs/Pleura: Central airways are clear. No pleural effusion. No confluent airspace disease. No pneumothorax. Upper Abdomen: Hiatal hernia. No acute finding of the upper abdomen. Diffusely decreased attenuation of the liver parenchyma. Musculoskeletal: No acute displaced fracture. Degenerative changes of the spine. Review of the MIP images confirms the above findings. . IMPRESSION: Negative for acute aortic syndrome. No acute finding of the chest. Hiatal hernia. Electronically Signed   By: Corrie Mckusick D.O.   On: 07/08/2018 11:59      Management plans discussed with the patient, family and they are in agreement.  CODE STATUS:     Code Status Orders  (From admission, onward)         Start     Ordered   07/08/18 1559  Full code  Continuous     07/08/18 1558       TOTAL  TIME TAKING CARE OF THIS PATIENT: 40 minutes.    Henreitta Leber M.D on 07/09/2018 at 4:46 PM  Between 7am to 6pm - Pager - [phone]  After 6pm go to [email or web address] - Proofreader  [facility]  Office  [phone]  CC: Primary care physician; Idelle Crouch, MD

## 2018-07-09 NOTE — Progress Notes (Signed)
CCMD reports 6 beat run of SVT. PT asymptomatic. No chest pain or SOB. MD Jannifer Franklin made aware. No new orders.

## 2018-07-09 NOTE — Progress Notes (Signed)
Progress Note  Patient Name: Gerald RUBERT Sr. Date of Encounter: 07/09/2018  Primary Cardiologist: new (Dr. Rockey Situ)  Subjective   Cardiac catheterization yesterday showed an occluded ramus which was treated successfully with PCI and drug-eluting stent placement.  The patient had no reflow that was treated with adenosine.  He reports resolution of chest pain since stent placement.  Inpatient Medications    Scheduled Meds: . aspirin EC  81 mg Oral Daily  . atorvastatin  80 mg Oral q1800  . enoxaparin (LOVENOX) injection  40 mg Subcutaneous Q24H  . glimepiride  2 mg Oral Q breakfast  . [START ON 07/10/2018] Influenza vac split quadrivalent PF  0.5 mL Intramuscular Tomorrow-1000  . insulin aspart  0-9 Units Subcutaneous TID WC  . pantoprazole  40 mg Oral Daily  . [START ON 07/10/2018] pneumococcal 23 valent vaccine  0.5 mL Intramuscular Tomorrow-1000  . sodium chloride flush  3 mL Intravenous Q12H  . ticagrelor  90 mg Oral BID   Continuous Infusions: . sodium chloride     PRN Meds: sodium chloride, acetaminophen **OR** acetaminophen, nitroGLYCERIN, ondansetron **OR** ondansetron (ZOFRAN) IV, polyethylene glycol, sodium chloride flush   Vital Signs    Vitals:   07/08/18 1738 07/08/18 1932 07/09/18 0314 07/09/18 0855  BP:  123/61 (!) 107/59 131/64  Pulse:  62 (!) 56 (!) 54  Resp:  20 18 18   Temp:  97.9 F (36.6 C) (!) 97.5 F (36.4 C) 98.3 F (36.8 C)  TempSrc:  Oral Oral Oral  SpO2: 98% 97% 97% 95%  Weight:      Height:        Intake/Output Summary (Last 24 hours) at 07/09/2018 1203 Last data filed at 07/09/2018 0500 Gross per 24 hour  Intake 400 ml  Output 800 ml  Net -400 ml   Filed Weights   07/08/18 1040 07/08/18 1124 07/08/18 1258  Weight: 83 kg 83 kg 74.8 kg    Telemetry    Sinus rhythm with 5 beat runs of ventricular tachycardia last night- Personally Reviewed  ECG    Normal sinus rhythm- Personally Reviewed  Physical Exam   GEN: No  acute distress.   Neck: No JVD Cardiac: RRR, no murmurs, rubs, or gallops.  Respiratory: Clear to auscultation bilaterally. GI: Soft, nontender, non-distended  MS: No edema; No deformity. Neuro:  Nonfocal  Psych: Normal affect  Right groin is intact with no hematoma.  There is mild bruising.  Labs    Chemistry Recent Labs  Lab 07/08/18 1029 07/09/18 0435  NA 141 137  K 4.3 4.6  CL 104 101  CO2 25 28  GLUCOSE 136* 117*  BUN 14 17  CREATININE 1.04 1.23  CALCIUM 10.1 8.8*  PROT 7.4  --   ALBUMIN 4.1  --   AST 36  --   ALT 32  --   ALKPHOS 53  --   BILITOT 0.8  --   GFRNONAA >60 54*  GFRAA >60 >60  ANIONGAP 12 8     Hematology Recent Labs  Lab 07/08/18 1029 07/09/18 0435  WBC 7.1 7.3  RBC 4.53 3.77*  HGB 15.7 13.1  HCT 45.8 38.8*  MCV 101.1* 102.9*  MCH 34.7* 34.7*  MCHC 34.3 33.8  RDW 13.2 13.3  PLT 182 147*    Cardiac Enzymes Recent Labs  Lab 07/08/18 1029 07/08/18 1723 07/08/18 2150 07/09/18 0435  TROPONINI 0.29* 5.90* 18.66* 26.57*   No results for input(s): TROPIPOC in the last 168 hours.   BNPNo  results for input(s): BNP, PROBNP in the last 168 hours.   DDimer No results for input(s): DDIMER in the last 168 hours.   Radiology    Dg Chest Portable 1 View  Result Date: 07/08/2018 CLINICAL DATA:  Worsening shortness of breath and chest pain, initial encounter EXAM: PORTABLE CHEST 1 VIEW COMPARISON:  None. FINDINGS: The heart size and mediastinal contours are within normal limits. Both lungs are clear. The visualized skeletal structures are unremarkable. IMPRESSION: No active disease. Electronically Signed   By: Inez Catalina M.D.   On: 07/08/2018 10:55   Ct Angio Chest Aorta W And/or Wo Contrast  Result Date: 07/08/2018 CLINICAL DATA:  77 year old male with chest pain EXAM: CT ANGIOGRAPHY CHEST WITH CONTRAST TECHNIQUE: Multidetector CT imaging of the chest was performed using the standard protocol during bolus administration of intravenous  contrast. Multiplanar CT image reconstructions and MIPs were obtained to evaluate the vascular anatomy. CONTRAST:  9mL ISOVUE-370 IOPAMIDOL (ISOVUE-370) INJECTION 76% COMPARISON:  06/06/2014 FINDINGS: Cardiovascular: Heart: No cardiomegaly. No pericardial fluid/thickening. No significant coronary calcifications. Aorta: Unremarkable course, caliber, contour of the thoracic aorta. No aneurysm or dissection flap. No periaortic fluid. Pulmonary arteries: The CT is not protocol for the most sensitive evaluation of the pulmonary arteries, however, there is no filling defect of the main pulmonary artery or the lobar pulmonary arteries. Mediastinum/Nodes: No mediastinal adenopathy. Unremarkable appearance of the thoracic esophagus. Unremarkable thoracic inlet Lungs/Pleura: Central airways are clear. No pleural effusion. No confluent airspace disease. No pneumothorax. Upper Abdomen: Hiatal hernia. No acute finding of the upper abdomen. Diffusely decreased attenuation of the liver parenchyma. Musculoskeletal: No acute displaced fracture. Degenerative changes of the spine. Review of the MIP images confirms the above findings. . IMPRESSION: Negative for acute aortic syndrome. No acute finding of the chest. Hiatal hernia. Electronically Signed   By: Corrie Mckusick D.O.   On: 07/08/2018 11:59    Cardiac Studies   Cardiac catheterization yesterday showed occluded ramus which was treated successfully with PCI and drug-eluting stent placement.  There was 60 to 70% mid LAD stenosis.  Ejection fraction was normal.  Patient Profile     77 y.o. male with past medical history of type 2 diabetes, hypertension and COPD who presented with non-ST elevation myocardial infarction  Assessment & Plan    1.  Non-ST elevation myocardial infarction: Cardiac catheterization showed occluded ramus which was the culprit.  Was treated successfully with PCI and drug-eluting stent placement.  He is chest pain-free since then.  He has residual  disease affecting the LAD which might require revascularization in the near future depending on his symptoms. I recommend continuing aspirin and Brilinta. The patient did have a 5 beat run of ventricular tachycardia last night and thus I am going to start him on small dose carvedilol.  He is no longer bradycardic. We increase his atorvastatin to 80 mg daily. If the patient ambulates today with no exertional symptoms, he can be discharged home from our standpoint.  2.  Hyperlipidemia: Recommend a target LDL of less than 70.  Atorvastatin was increased from 10 to 80 mg daily.    We will arrange for follow-up in our office in 1 to 2 weeks.     For questions or updates, please contact [place] Please consult [email or web address] for contact info under        Signed, Kathlyn Sacramento, MD  07/09/2018, 12:03 PM

## 2018-07-09 NOTE — Care Management Note (Signed)
Case Management Note  Patient Details  Name: Gerald VERCHER Sr. MRN: [id number] Date of Birth: 1940/09/30  Subjective/Objective:  Brilinta voucher given                 Action/Plan:   Expected Discharge Date:  07/09/18               Expected Discharge Plan:  Home/Self Care  In-House Referral:     Discharge planning Services  CM Consult, Medication Assistance  Post Acute Care Choice:    Choice offered to:     DME Arranged:    DME Agency:     HH Arranged:    HH Agency:     Status of Service:  Completed, signed off  If discussed at [employer] of Stay Meetings, dates discussed:    Additional Comments:  Latanya Maudlin, RN 07/09/2018, 12:55 PM

## 2018-07-09 NOTE — Progress Notes (Signed)
Pt ambulated on room air 3 loops around nurses' station. Tolerated exercise well. Pt to be discharged today. Iv and tele removed. disch instructions given to pt along with 3 prescriptions. Awaiting pt's sister for transport.

## 2018-07-11 ENCOUNTER — Encounter: Payer: Self-pay | Admitting: Cardiovascular Disease

## 2018-07-14 ENCOUNTER — Encounter: Payer: Self-pay | Admitting: Physician Assistant

## 2018-07-14 NOTE — Progress Notes (Signed)
Cardiology Office Note Date:  07/18/2018  Patient ID:  Gerald Mcgrath., DOB Feb 01, 1941, MRN [id number] PCP:  Idelle Crouch, MD  Cardiologist:  Dr. Rockey Situ, MD    Chief Complaint: Hospital follow up  History of Present Illness: Gerald MCKOWEN Sr. is a 77 y.o. 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   Patient was admitted to [facility] 07/08/2018 with a NSTEMI. Troponin peaked at 26.57. He underwent LHC on 07/08/2018 that showed left main no significant disease, mid LAD 60-70% stenosis, LCx without significant disease, RCA without significant disease, ostial ramus 100% stenosed and felt to be the culprit for his NSTEMI. LVEF estimated at 55% and unable to exclude RWMA secondary to ectopy. He underwent successful PCI/DES to the ostial ramus without residual stenosis with the case being complicated by no reflow due to distal embolization that was treated with intracoronary adenosine. It was also recommended to consider FFR evaluation of the LAD lesion based on recurrent symptoms. Post cath renal function and cbc were stable with a slightly low platelet count of 147. Post-cath he was noted to have a 5 beat run of NSVT. He was discharged on ASA, Brilinta, Coreg, Lipitor, along with his non-cardiac medications.   He comes in accompanied by his sister today.  He is doing well from a cardiac perspective.  He has not had any further angina.  He does note chronic shortness of breath that predates his MI as above.  He has been compliant with all medications and denies missing any doses of dual antiplatelet therapy.  He is not checking his blood  pressure at home.  No falls since his discharge.  No BRBPR or melena.  He denies any orthopnea or lower extremity swelling.  No abdominal distention.  Appetite remains low.  He does not have any questions or concerns at this time.    Past Medical History:  Diagnosis Date  . CAD (coronary artery disease)    a/ NSTEMI 10/19: LHC 10/19: LM w/o sig dz, mLAD 60-70%, LCx w/o sig dz, RCA w/o sig dz, ost ramus 100% s/p PCI/DES, EF 55%  . Carotid artery disease ([place])    a/ s/p R CEA  . COPD (chronic obstructive pulmonary disease) ([place])   . Diabetes mellitus with complication ([place])   . Essential hypertension   . Fibromyalgia   . HLD (hyperlipidemia)   . Polyneuropathy   . Tubular adenoma of colon     Past Surgical History:  Procedure Laterality Date  . CAROTID ENDARTERECTOMY Right   . CATARACT EXTRACTION W/PHACO Left 02/02/2018   Procedure: CATARACT EXTRACTION PHACO AND INTRAOCULAR LENS PLACEMENT ([place]) LEFT DIABETIC;  Surgeon: Leandrew Koyanagi, MD;  Location: [place];  Service: Ophthalmology;  Laterality: Left;  DIABETIC  . COLONOSCOPY N/A 07/21/2016   Procedure: COLONOSCOPY;  Surgeon: Lollie Sails, MD;  Location: [facility] ENDOSCOPY;  Service: Endoscopy;  Laterality: N/A;  CBC; PT/ INR  . CORONARY STENT INTERVENTION N/A 07/08/2018   Procedure: CORONARY STENT INTERVENTION;  Surgeon: Wellington Hampshire, MD;  Location: [place] CV LAB;  Service: Cardiovascular;  Laterality: N/A;  . EYE SURGERY    . LEFT HEART CATH  AND CORONARY ANGIOGRAPHY Right 07/08/2018   Procedure: LEFT HEART CATH AND CORONARY ANGIOGRAPHY possible PCI;  Surgeon: Minna Merritts, MD;  Location: [place] CV LAB;  Service: Cardiovascular;  Laterality: Right;    Current Meds  Medication Sig  . acetaminophen (TYLENOL) 500 MG tablet Take 1,000 mg by mouth every 6 (six) hours as needed for mild pain.   Marland Kitchen aspirin 81 MG tablet Take 81 mg by mouth daily.  Marland Kitchen atorvastatin (LIPITOR) 80 MG tablet Take 1 tablet  (80 mg total) by mouth daily.  . carvedilol (COREG) 3.125 MG tablet Take 1 tablet (3.125 mg total) by mouth 2 (two) times daily with a meal.  . glimepiride (AMARYL) 2 MG tablet Take 2 mg by mouth daily with breakfast.  . omeprazole (PRILOSEC) 20 MG capsule Take 20 mg by mouth daily.  . ticagrelor (BRILINTA) 90 MG TABS tablet Take 1 tablet (90 mg total) by mouth 2 (two) times daily.    Allergies:   Cephalexin and Penicillins   Social History:  The patient  reports that he has quit smoking. He uses smokeless tobacco. He reports that he drank alcohol. He reports that he does not use drugs.   Family History:  The patient's family history is not on file.  ROS:   Review of Systems  Constitutional: Negative for chills, diaphoresis, fever, malaise/fatigue and weight loss.  HENT: Negative for congestion.   Eyes: Negative for discharge and redness.  Respiratory: Positive for shortness of breath. Negative for cough, hemoptysis, sputum production and wheezing.   Cardiovascular: Negative for chest pain, palpitations, orthopnea, claudication, leg swelling and PND.  Gastrointestinal: Negative for abdominal pain, blood in stool, heartburn, melena, nausea and vomiting.  Genitourinary: Negative for hematuria.  Musculoskeletal: Negative for falls and myalgias.  Skin: Negative for rash.  Neurological: Negative for dizziness, tingling, tremors, sensory change, speech change, focal weakness, loss of consciousness and weakness.  Endo/Heme/Allergies: Does not bruise/bleed easily.  Psychiatric/Behavioral: Negative for substance abuse. The patient is not nervous/anxious.   All other systems reviewed and are negative.    PHYSICAL EXAM:  VS:  BP 128/70 (BP Location: Left Arm, Patient Position: Sitting, Cuff Size: Normal)   Pulse (!) 53   Ht 5\' 10"  (1.778 m)   Wt 173 lb 4 oz (78.6 kg)   BMI 24.86 kg/m  BMI: Body mass index is 24.86 kg/m.  Physical Exam  Constitutional: He is oriented to person, place, and  time. He appears well-developed and well-nourished.  HENT:  Head: Normocephalic and atraumatic.  Eyes: Right eye exhibits no discharge. Left eye exhibits no discharge.  Neck: Normal range of motion. No JVD present.  Cardiovascular: Normal rate, regular rhythm, S1 normal, S2 normal and normal heart sounds. Exam reveals no distant heart sounds, no friction rub, no midsystolic click and no opening snap.  No murmur heard. Pulses:      Posterior tibial pulses are 2+ on the right side, and 2+ on the left side.  Pulmonary/Chest: Effort normal and breath sounds normal. No respiratory distress. He has no decreased breath sounds. He has no wheezes. He has no rales. He exhibits no tenderness.  Abdominal: Soft. He exhibits no distension. There is no tenderness.  Musculoskeletal: He exhibits no edema.  Neurological: He is alert and oriented to person, place, and time.  Skin: Skin is warm and dry. No cyanosis. Nails show no clubbing.  Psychiatric: He has a normal mood and affect. His speech is normal and behavior is normal. Judgment and thought content  normal.     EKG:  Was ordered and interpreted by me today. Shows sinus bradycardia, 53 bpm, lateral TWI  Recent Labs: 07/08/2018: ALT 32 07/09/2018: BUN 17; Creatinine, Ser 1.23; Hemoglobin 13.1; Platelets 147; Potassium 4.6; Sodium 137  No results found for requested labs within last 8760 hours.   Estimated Creatinine Clearance: 51.1 mL/min (by C-G formula based on SCr of 1.23 mg/dL).   Wt Readings from Last 3 Encounters:  07/18/18 173 lb 4 oz (78.6 kg)  07/08/18 165 lb (74.8 kg)  02/02/18 177 lb (80.3 kg)     Other studies reviewed: Additional studies/records reviewed today include: summarized above  ASSESSMENT AND PLAN:  1. CAD involving the native coronary arteries without angina: He is doing well without any symptoms concerning for angina.  Continue dual antiplatelet therapy with aspirin 81 mg daily and Brilinta 90 mg twice daily without  interruption for at least the next 12 months.  Consider repeat ischemic evaluation based on symptoms for his residual LAD stenosis.  We will obtain an echocardiogram to further evaluate his dyspnea as below.  His dyspnea certainly could be related to his residual LAD stenosis and if this persists we we will pursue a functional study to evaluate clinical significance of his LAD stenosis.  Aggressive risk factor modification with secondary prevention.  Cardiac rehab.   2. Dyspnea: Predates his non-STEMI.  Check echocardiogram.  Given this is unchanged and predates his non-STEMI this is less likely related to Brilinta.  Nonetheless, I did advise him he could take Brilinta with a little bit of caffeine to see if this helps.  Await echocardiogram.  3. Hyperlipidemia: Goal LDL less than 70.  Check lipid panel and liver function today.  Continue atorvastatin 80 mg daily.  4. NSVT: No ectopy noted on twelve-lead EKG today.  Carvedilol has been held given sinus bradycardia with a heart rate of 53 bpm at the office today.  Continue to monitor.  5. Hyperglycemia: Check hemoglobin A1c.  Disposition: F/u with Dr. Rockey Situ in 3 months.   Current medicines are reviewed at length with the patient today.  The patient did not have any concerns regarding medicines.  Signed, Christell Faith, PA-C 07/18/2018 8:37 AM     [place] [address] [place] [address] [place], [place] [address] [phone]

## 2018-07-18 ENCOUNTER — Telehealth: Payer: Self-pay | Admitting: *Deleted

## 2018-07-18 ENCOUNTER — Ambulatory Visit: Payer: Medicare Other | Admitting: Physician Assistant

## 2018-07-18 ENCOUNTER — Encounter: Payer: Self-pay | Admitting: Physician Assistant

## 2018-07-18 VITALS — BP 128/70 | HR 53 | Ht 70.0 in | Wt 173.2 lb

## 2018-07-18 DIAGNOSIS — I4729 Other ventricular tachycardia: Secondary | ICD-10-CM

## 2018-07-18 DIAGNOSIS — E782 Mixed hyperlipidemia: Secondary | ICD-10-CM | POA: Diagnosis not present

## 2018-07-18 DIAGNOSIS — I214 Non-ST elevation (NSTEMI) myocardial infarction: Secondary | ICD-10-CM

## 2018-07-18 DIAGNOSIS — I472 Ventricular tachycardia: Secondary | ICD-10-CM

## 2018-07-18 DIAGNOSIS — R739 Hyperglycemia, unspecified: Secondary | ICD-10-CM

## 2018-07-18 DIAGNOSIS — I2510 Atherosclerotic heart disease of native coronary artery without angina pectoris: Secondary | ICD-10-CM | POA: Diagnosis not present

## 2018-07-18 DIAGNOSIS — R0602 Shortness of breath: Secondary | ICD-10-CM

## 2018-07-18 MED ORDER — TICAGRELOR 90 MG PO TABS: 90.0000 mg | ORAL_TABLET | Freq: Two times a day (BID) | ORAL | 3 refills | Status: AC

## 2018-07-18 NOTE — Telephone Encounter (Signed)
Sent Brilinta refill in for patient during office visit today. Did not put for 180 tablets for the 3 month supply. Called pharmacy and had them update their record so patient could get 3 month supply at a time.

## 2018-07-18 NOTE — Patient Instructions (Signed)
Medication Instructions:  Your physician has recommended you make the following change in your medication:  1- STOP Coreg(Carvedilol).  If you need a refill on your cardiac medications before your next appointment, please call your pharmacy.   Lab work: Your physician recommends that you return for lab work in: TODAY - CBC, CMET, LIPID, HEMOGLOBIN A1c.  If you have labs (blood work) drawn today and your tests are completely normal, you will receive your results only by: Marland Kitchen MyChart Message (if you have MyChart) OR . A paper copy in the mail If you have any lab test that is abnormal or we need to change your treatment, we will call you to review the results.  Testing/Procedures: Your physician has requested that you have an echocardiogram. Echocardiography is a painless test that uses sound waves to create images of your heart. It provides your doctor with information about the size and shape of your heart and how well your heart's chambers and valves are working. This procedure takes approximately one hour. There are no restrictions for this procedure. You may get an IV, if needed, to receive an ultrasound enhancing agent through to better visualize your heart.     Follow-Up: At [facility], you and your health needs are our priority.  As part of our continuing mission to provide you with exceptional heart care, we have created designated Provider Care Teams.  These Care Teams include your primary Cardiologist (physician) and Advanced Practice Providers (APPs -  Physician Assistants and Nurse Practitioners) who all work together to provide you with the care you need, when you need it. You will need a follow up appointment in 3 months.  You may see DR Rockey Situ or one of the following Advanced Practice Providers on your designated Care Team:   Murray Hodgkins, NP Christell Faith, PA-C . Marrianne Mood, PA-C

## 2018-07-19 LAB — CBC WITH DIFFERENTIAL/PLATELET
BASOS ABS: 0.1 10*3/uL (ref 0.0–0.2)
Basos: 1 %
EOS (ABSOLUTE): 0.2 10*3/uL (ref 0.0–0.4)
Eos: 2 %
Hematocrit: 45.3 % (ref 37.5–51.0)
Hemoglobin: 15.8 g/dL (ref 13.0–17.7)
IMMATURE GRANS (ABS): 0 10*3/uL (ref 0.0–0.1)
IMMATURE GRANULOCYTES: 0 %
LYMPHS: 18 %
Lymphocytes Absolute: 1.3 10*3/uL (ref 0.7–3.1)
MCH: 35.5 pg — ABNORMAL HIGH (ref 26.6–33.0)
MCHC: 34.9 g/dL (ref 31.5–35.7)
MCV: 102 fL — ABNORMAL HIGH (ref 79–97)
MONOS ABS: 0.7 10*3/uL (ref 0.1–0.9)
Monocytes: 10 %
NEUTROS PCT: 69 %
Neutrophils Absolute: 4.7 10*3/uL (ref 1.4–7.0)
Platelets: 251 10*3/uL (ref 150–450)
RBC: 4.45 x10E6/uL (ref 4.14–5.80)
RDW: 13 % (ref 12.3–15.4)
WBC: 6.9 10*3/uL (ref 3.4–10.8)

## 2018-07-19 LAB — COMPREHENSIVE METABOLIC PANEL
ALT: 27 IU/L (ref 0–44)
AST: 24 IU/L (ref 0–40)
Albumin/Globulin Ratio: 1.6 (ref 1.2–2.2)
Albumin: 4.4 g/dL (ref 3.5–4.8)
Alkaline Phosphatase: 60 IU/L (ref 39–117)
BILIRUBIN TOTAL: 0.8 mg/dL (ref 0.0–1.2)
BUN/Creatinine Ratio: 18 (ref 10–24)
BUN: 21 mg/dL (ref 8–27)
CALCIUM: 10.1 mg/dL (ref 8.6–10.2)
CO2: 23 mmol/L (ref 20–29)
Chloride: 102 mmol/L (ref 96–106)
Creatinine, Ser: 1.16 mg/dL (ref 0.76–1.27)
GFR calc non Af Amer: 60 mL/min/{1.73_m2} (ref >59)
GFR, EST AFRICAN AMERICAN: 69 mL/min/{1.73_m2} (ref >59)
GLUCOSE: 136 mg/dL — AB (ref 65–99)
Globulin, Total: 2.8 g/dL (ref 1.5–4.5)
Potassium: 5.2 mmol/L (ref 3.5–5.2)
Sodium: 141 mmol/L (ref 134–144)
Total Protein: 7.2 g/dL (ref 6.0–8.5)

## 2018-07-19 LAB — LIPID PANEL
Chol/HDL Ratio: 2.8 ratio (ref 0.0–5.0)
Cholesterol, Total: 135 mg/dL (ref 100–199)
HDL: 49 mg/dL (ref >39)
LDL Calculated: 61 mg/dL (ref 0–99)
TRIGLYCERIDES: 127 mg/dL (ref 0–149)
VLDL CHOLESTEROL CAL: 25 mg/dL (ref 5–40)

## 2018-07-19 LAB — HEMOGLOBIN A1C
ESTIMATED AVERAGE GLUCOSE: 134 mg/dL
HEMOGLOBIN A1C: 6.3 % — AB (ref 4.8–5.6)

## 2018-07-25 ENCOUNTER — Telehealth: Payer: Self-pay | Admitting: Physician Assistant

## 2018-07-25 NOTE — Telephone Encounter (Signed)
Pt c/o BP issue: STAT if pt c/o blurred vision, one-sided weakness or slurred speech  1. What are your last 5 BP readings?  11.4.2019-138/70 HR 60   2. Are you having any other symptoms (ex. Dizziness, headache, blurred vision, passed out)? A little short of breath  3. What is your BP issue? Pt sister is just calling with pt reading

## 2018-07-25 NOTE — Telephone Encounter (Signed)
Called and spoke with patient. They just wanted [place] to know his BP/HR from this morning. Denies chest pain or dizziness. Only complaint is some shortness of breath which is known. Patient has echo later this week on 07/28/18. He was appreciative.

## 2018-07-28 ENCOUNTER — Ambulatory Visit (INDEPENDENT_AMBULATORY_CARE_PROVIDER_SITE_OTHER): Payer: Medicare Other

## 2018-07-28 ENCOUNTER — Other Ambulatory Visit: Payer: Self-pay

## 2018-07-28 DIAGNOSIS — R0602 Shortness of breath: Secondary | ICD-10-CM

## 2018-08-04 ENCOUNTER — Telehealth: Payer: Self-pay | Admitting: *Deleted

## 2018-08-04 DIAGNOSIS — R0602 Shortness of breath: Secondary | ICD-10-CM

## 2018-08-04 NOTE — Telephone Encounter (Signed)
-----   Message from Rise Mu, PA-C sent at 07/30/2018  4:19 PM EST ----- Echo showed normal pump function with normal wall motion. There was slight stiffening of the heart as well as a mildly dilated left atrium. The right side of the heart was functioning normally. The pressure in the vessels that go to the lungs was normal.   Reassuring echo. No echocardiogram evidence for his long standing dyspnea.  Optimal BP/HR control advised - both were well controlled at his last office visit.   Given his dyspnea in the setting of unrevealing echo, please see if he is ok with evaluation by pulmonology.

## 2018-08-04 NOTE — Telephone Encounter (Signed)
Pt is returning your call

## 2018-08-04 NOTE — Telephone Encounter (Signed)
Reviewed results and recommendations of echocardiogram and patient was agreeable with pulmonary consult. Placed consult for pulmonary and advised that scheduling would call him to arrange appointment. He verbalized understanding with no further questions at this time.

## 2018-08-04 NOTE — Telephone Encounter (Signed)
Left voicemail message for patient to call back.

## 2018-10-23 DIAGNOSIS — E782 Mixed hyperlipidemia: Secondary | ICD-10-CM | POA: Insufficient documentation

## 2018-10-23 DIAGNOSIS — R0602 Shortness of breath: Secondary | ICD-10-CM | POA: Insufficient documentation

## 2018-10-23 DIAGNOSIS — I25118 Atherosclerotic heart disease of native coronary artery with other forms of angina pectoris: Secondary | ICD-10-CM | POA: Insufficient documentation

## 2018-10-23 NOTE — Progress Notes (Signed)
Cardiology Office Note  Date:  10/24/2018   ID:  Gerald KAZMIERCZAK Sr., DOB 1941/05/03, MRN [id number]  PCP:  Idelle Crouch, MD   Chief Complaint  Patient presents with  . other    3 month follow up and discuss Echo. Pt. c/o chest pain and shortness of breath. Meds reviewed by the pt. verbally.     HPI:  Gerald Mcgrath is a 78 year old gentleman with past medical history of smoking,  diabetes,  hyperlipidemia  presenting to the hospital October 2019 with unstable angina Troponin elevation  Pain relieved with sublingual nitro Nitropaste and heparin Brought to the cardiac catheterization lab given symptoms of unstable angina, non-STEMI Stent placed to ramus intermedius Who presents for follow-up of his coronary artery disease  Cath 07/08/2018 Severe single-vessel disease of ramus branch, 100% occluded ostial/proximal region Moderate to severe mid LAD disease  Successful angioplasty and drug-eluting stent placement to the ramus intermedius.  This was complicated by no reflow due to distal embolization.  This was treated successfully with intracoronary adenosine.  Seen in clinic 07/18/2018, was doing well chronic shortness of breath that predates his MI as above  Recent echocardiogram July 28, 2018 results discussed with him in detail Normal ejection fraction 55 to 40%, diastolic dysfunction, mild left atrial enlargement otherwise normal study  Reports that he ran out of his medications for 1 month, recently refilled by primary care Denies any chest pain or shortness of breath concerning for angina He does have some left flank pain tender on palpation and with movement, thinks he heard a rib Recently had chest x-ray with Dr. Doy Hutching Reports he is " always doing something" Maintains a house by himself  EKG personally reviewed by myself on todays visit Shows normal sinus rhythm/sinus bradycardia rate 47 bpm T wave abnormality V5, V6 1 and aVL   PMH:   has a past  medical history of CAD (coronary artery disease), Carotid artery disease ([place]), COPD (chronic obstructive pulmonary disease) ([place]), Diabetes mellitus with complication ([place]), Essential hypertension, Fibromyalgia, HLD (hyperlipidemia), Polyneuropathy, and Tubular adenoma of colon.  PSH:    Past Surgical History:  Procedure Laterality Date  . CAROTID ENDARTERECTOMY Right   . CATARACT EXTRACTION W/PHACO Left 02/02/2018   Procedure: CATARACT EXTRACTION PHACO AND INTRAOCULAR LENS PLACEMENT ([place]) LEFT DIABETIC;  Surgeon: Leandrew Koyanagi, MD;  Location: [place];  Service: Ophthalmology;  Laterality: Left;  DIABETIC  . COLONOSCOPY N/A 07/21/2016   Procedure: COLONOSCOPY;  Surgeon: Lollie Sails, MD;  Location: [facility] ENDOSCOPY;  Service: Endoscopy;  Laterality: N/A;  CBC; PT/ INR  . CORONARY STENT INTERVENTION N/A 07/08/2018   Procedure: CORONARY STENT INTERVENTION;  Surgeon: Wellington Hampshire, MD;  Location: [place] [facility];  Service: Cardiovascular;  Laterality: N/A;  . EYE SURGERY    . LEFT HEART CATH AND CORONARY ANGIOGRAPHY Right 07/08/2018   Procedure: LEFT HEART CATH AND CORONARY ANGIOGRAPHY possible PCI;  Surgeon: Minna Merritts, MD;  Location: [place] [facility];  Service: Cardiovascular;  Laterality: Right;    Current Outpatient Medications  Medication Sig Dispense Refill  . acetaminophen (TYLENOL) 500 MG tablet Take 1,000 mg by mouth every 6 (six) hours as needed for mild pain.     Marland Kitchen aspirin 81 MG tablet Take 81 mg by mouth daily.    Marland Kitchen atorvastatin (LIPITOR) 80 MG tablet Take 1 tablet (80 mg total) by mouth daily. 30 tablet 1  . glimepiride (AMARYL) 2 MG tablet Take 2 mg by mouth daily with  breakfast.    . omeprazole (PRILOSEC) 20 MG capsule Take 20 mg by mouth daily.    . ticagrelor (BRILINTA) 90 MG TABS tablet Take by mouth 2 (two) times daily.     No current facility-administered medications for this visit.     Allergies:   Cephalexin and Penicillins    Social History:  The patient  reports that he quit smoking about 10 years ago. He smoked 3.00 packs per day. He uses smokeless tobacco. He reports previous alcohol use. He reports that he does not use drugs.   Family History:   family history is not on file.    Review of Systems: Review of Systems  Constitutional: Negative.   Respiratory: Negative.   Cardiovascular: Negative.   Gastrointestinal: Negative.   Musculoskeletal: Negative.   Neurological: Negative.   Psychiatric/Behavioral: Negative.   All other systems reviewed and are negative.   PHYSICAL EXAM: VS:  BP 128/66 (BP Location: Left Arm, Patient Position: Sitting, Cuff Size: Normal)   Pulse (!) 47   Ht 5\' 9"  (1.753 m)   Wt 174 lb 12 oz (79.3 kg)   BMI 25.81 kg/m  , BMI Body mass index is 25.81 kg/m. GEN: Well nourished, well developed, in no acute distress  HEENT: normal  Neck: no JVD, carotid bruits, or masses Cardiac: RRR; no murmurs, rubs, or gallops,no edema  Respiratory:  clear to auscultation bilaterally, normal work of breathing GI: soft, nontender, nondistended, + BS MS: no deformity or atrophy  Skin: warm and dry, no rash Neuro:  Strength and sensation are intact Psych: euthymic mood, full affect   Recent Labs: 07/18/2018: ALT 27; BUN 21; Creatinine, Ser 1.16; Hemoglobin 15.8; Platelets 251; Potassium 5.2; Sodium 141    Lipid Panel Lab Results  Component Value Date   CHOL 135 07/18/2018   HDL 49 07/18/2018   LDLCALC 61 07/18/2018   TRIG 127 07/18/2018      Wt Readings from Last 3 Encounters:  10/24/18 174 lb 12 oz (79.3 kg)  07/18/18 173 lb 4 oz (78.6 kg)  07/08/18 165 lb (74.8 kg)      ASSESSMENT AND PLAN:  Coronary artery disease of native artery of native heart with stable angina pectoris ([place]) - Plan: EKG 12-Lead Currently with no symptoms of angina. No further workup at this time. Continue current medication regimen. Was out of his medications for 1 month, recently refilled by  Dr. Doy Hutching  SOB (shortness of breath) Chronic stable shortness of breath, very active at baseline No further work-up needed, normal echocardiogram reviewed with him  Mixed hyperlipidemia Recent dramatic climb in his cholesterol numbers as he is off the Lipitor Now back on the medication after refill by primary care  Atypical chest pain Long discussion with him, likely musculoskeletal in nature, tender on palpation Recommended heat/ice, minimal NSAIDs, may need muscle relaxer pills Minimize any exertion/heavy lifting  Disposition:   F/U  12 months   Total encounter time more than 25 minutes  Greater than 50% was spent in counseling and coordination of care with the patient    Orders Placed This Encounter  Procedures  . EKG 12-Lead     Signed, Esmond Plants, M.D., Ph.D. 10/24/2018  [place], [place]

## 2018-10-24 ENCOUNTER — Ambulatory Visit: Payer: Medicare Other | Admitting: Cardiovascular Disease

## 2018-10-24 ENCOUNTER — Encounter: Payer: Self-pay | Admitting: Cardiovascular Disease

## 2018-10-24 VITALS — BP 128/66 | HR 47 | Ht 69.0 in | Wt 174.8 lb

## 2018-10-24 DIAGNOSIS — R079 Chest pain, unspecified: Secondary | ICD-10-CM

## 2018-10-24 DIAGNOSIS — E782 Mixed hyperlipidemia: Secondary | ICD-10-CM | POA: Diagnosis not present

## 2018-10-24 DIAGNOSIS — I25118 Atherosclerotic heart disease of native coronary artery with other forms of angina pectoris: Secondary | ICD-10-CM | POA: Diagnosis not present

## 2018-10-24 DIAGNOSIS — R0602 Shortness of breath: Secondary | ICD-10-CM | POA: Diagnosis not present

## 2018-10-24 DIAGNOSIS — R0789 Other chest pain: Secondary | ICD-10-CM | POA: Diagnosis not present

## 2018-10-24 NOTE — Patient Instructions (Signed)

## 2019-10-06 IMAGING — DX DG CHEST 1V PORT
1 series · 2 of 2 positions shown · non-contrast
Comparison: None.

CLINICAL DATA: Worsening shortness of breath and chest pain,
initial encounter

EXAM:
PORTABLE CHEST 1 VIEW

[Series 1: chest ap · 0.14mm/px · 2 of 2 slices shown]
[im 1/2]
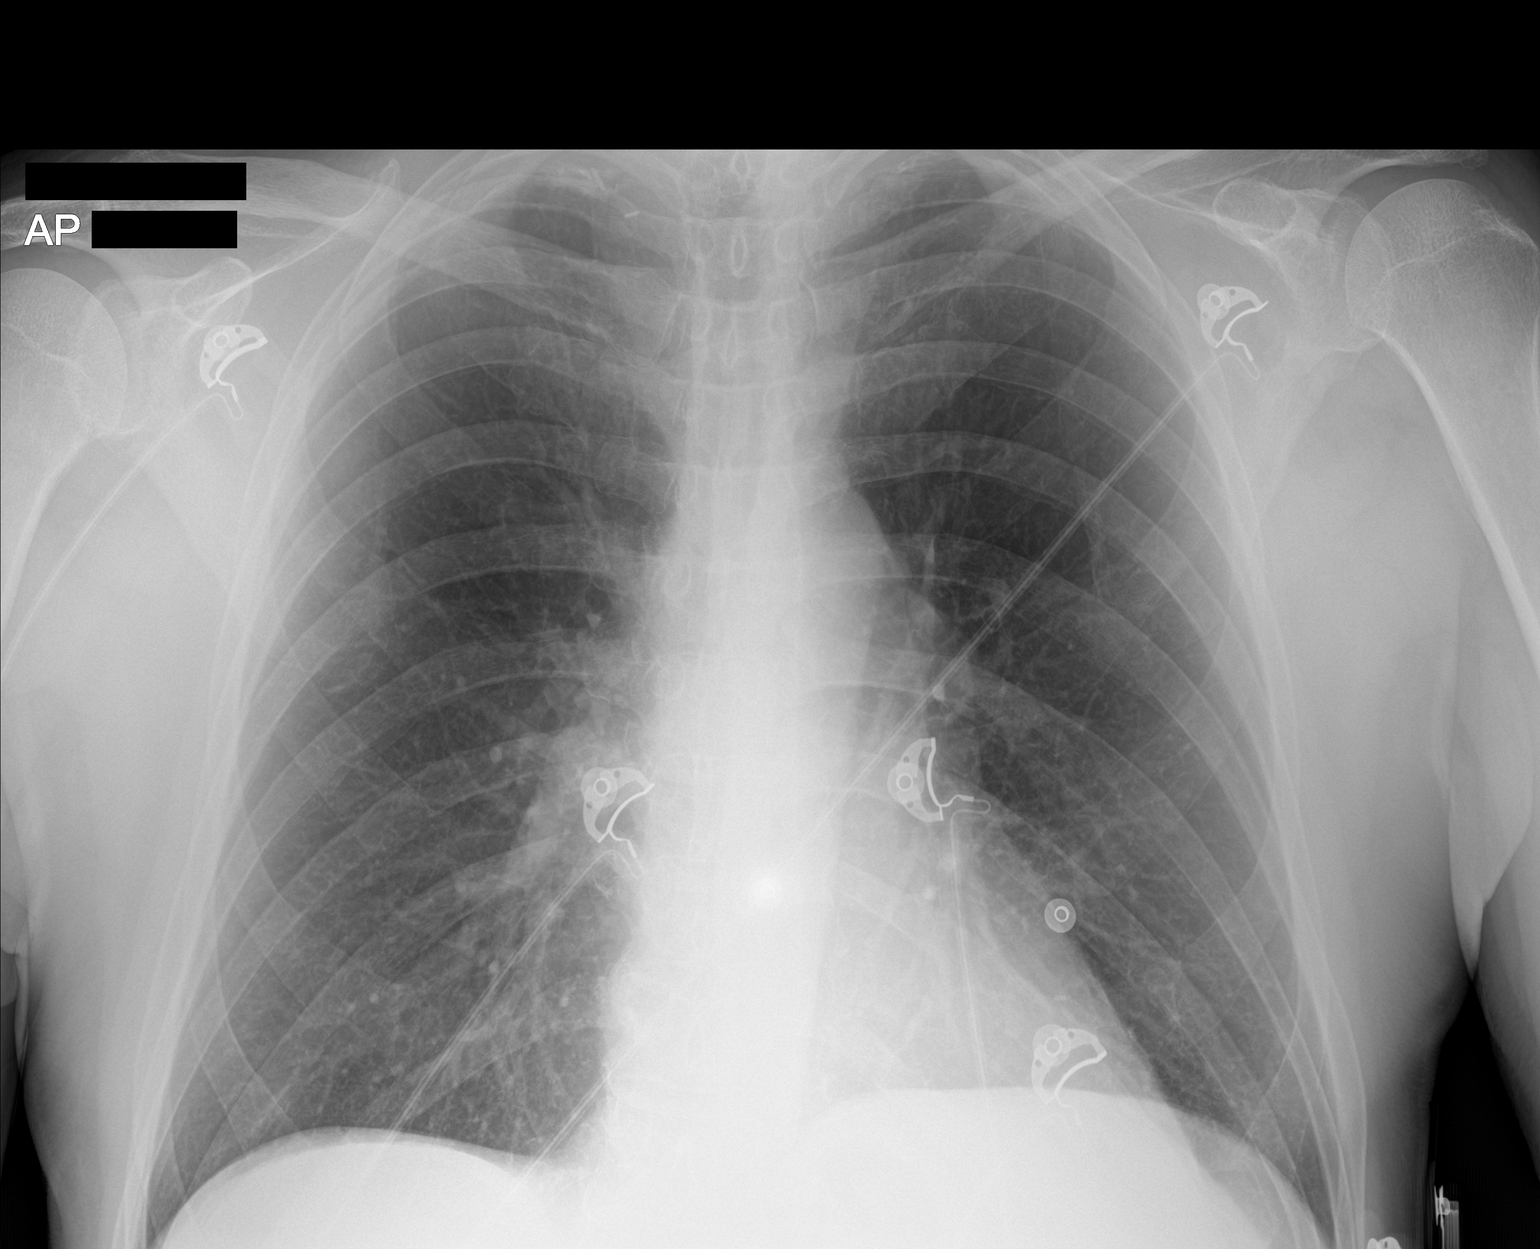
[im 2/2]
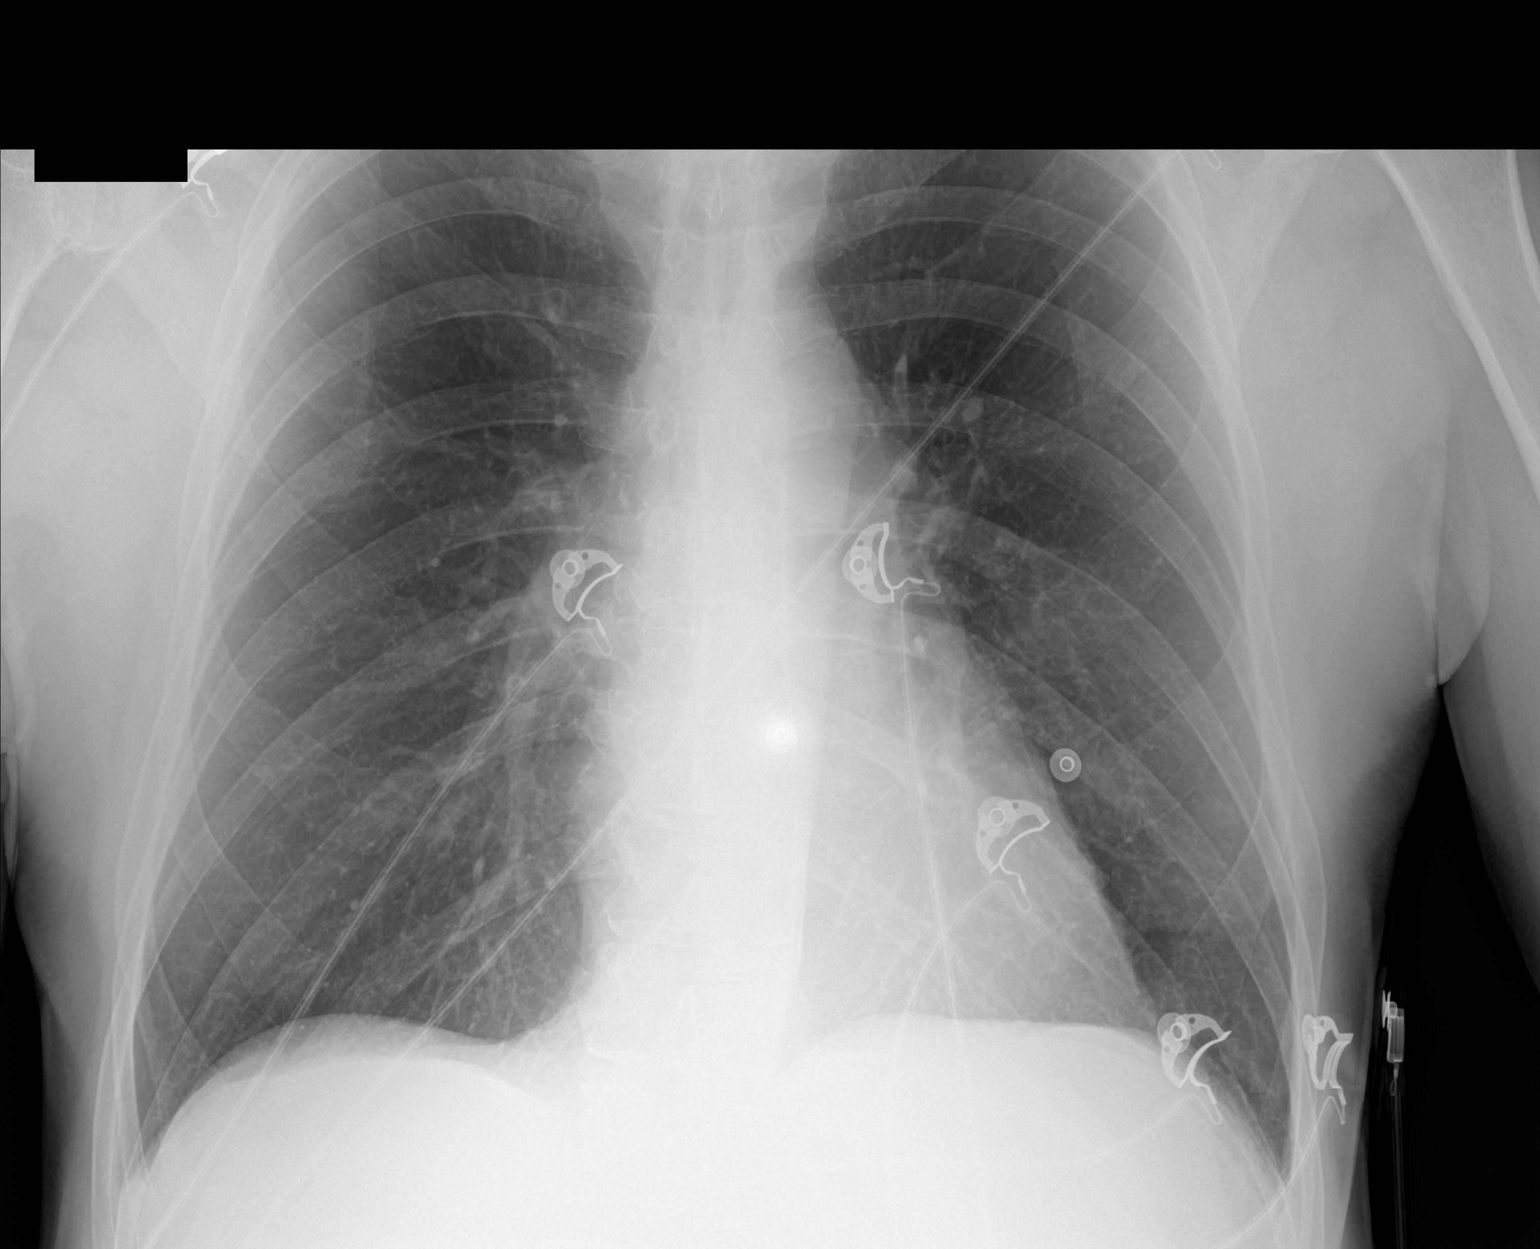

[2 of 2 positions shown; findings below may reference images not displayed]

FINDINGS: The heart size and mediastinal contours are within normal limits.
Both lungs are clear. The visualized skeletal structures are
unremarkable.
IMPRESSION: No active disease.

## 2019-10-06 IMAGING — CT CT ANGIO CHEST
4 of 7 series · 19 of 36 positions shown · IV contrast (APPLIED)
Comparison: 06/06/2014

CLINICAL DATA: 77-year-old male with chest pain

EXAM:
CT ANGIOGRAPHY CHEST WITH CONTRAST
TECHNIQUE: Multidetector CT imaging of the chest was performed using the
standard protocol during bolus administration of intravenous
contrast. Multiplanar CT image reconstructions and MIPs were
obtained to evaluate the vascular anatomy.
CONTRAST:  75mL 3KOK3L-01K IOPAMIDOL (3KOK3L-01K) INJECTION 76%

[Series 4: axial pre · axial · non-contrast · 0.70mm/px · z∈[-340,-170]mm · 3 of 69 slices shown]
[im 18/69  lung]
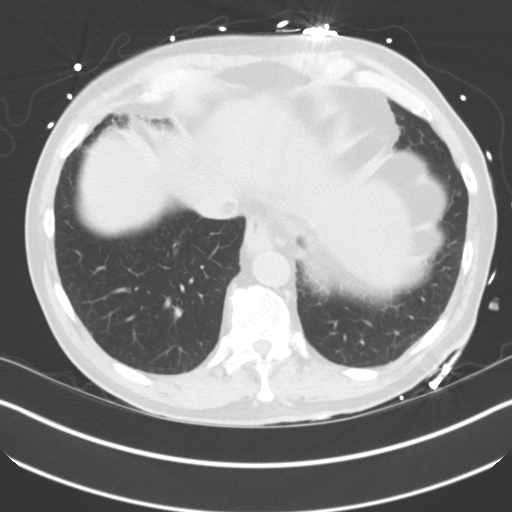
[im 35/69  lung]
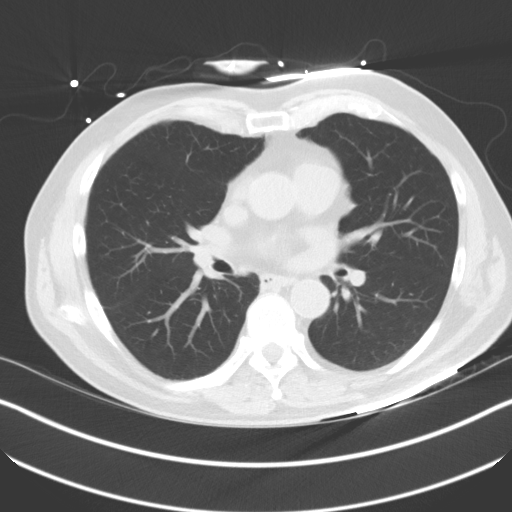
[im 52/69  lung]
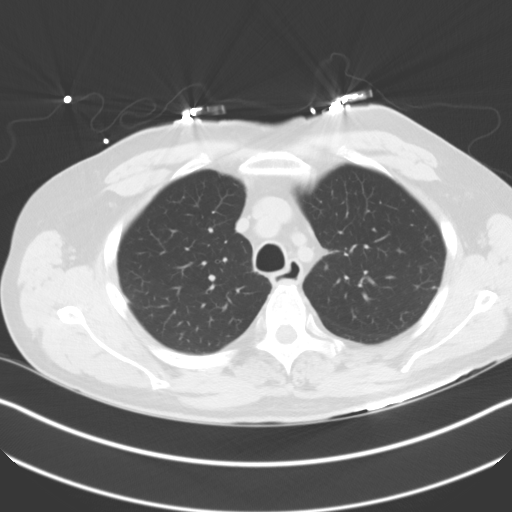

[Series 5: axial arterial · axial · arterial · 0.70mm/px · z∈[-391,-130]mm · 7 of 117 slices shown]
[im 15/117  lung]
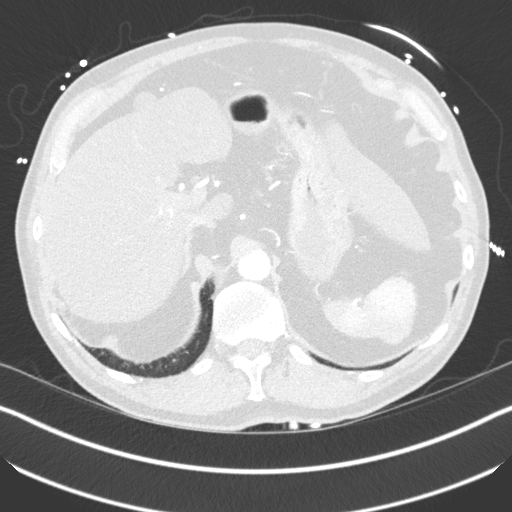
[im 30/117  mediastinal]
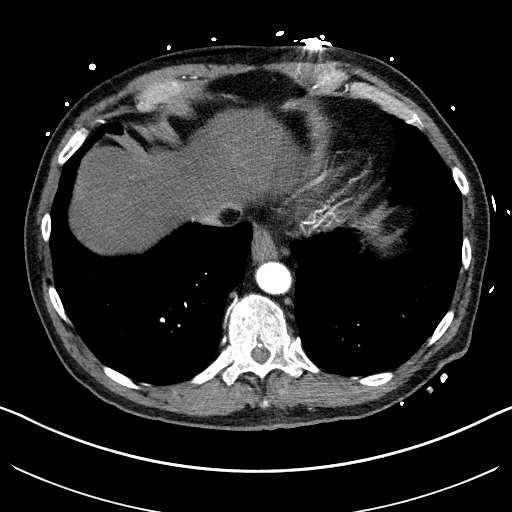
[im 44/117  lung]
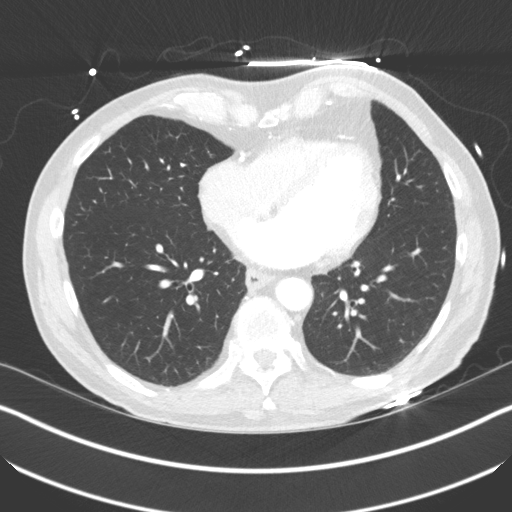
[im 59/117  mediastinal]
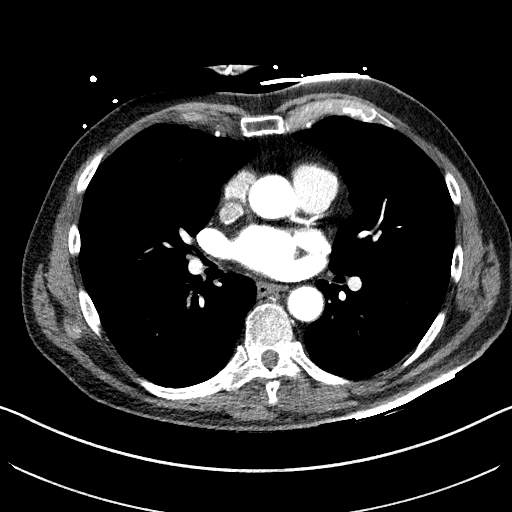
[im 73/117  lung]
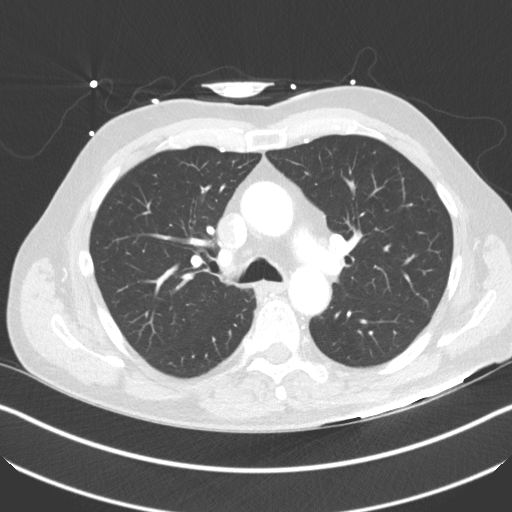
[im 88/117  mediastinal]
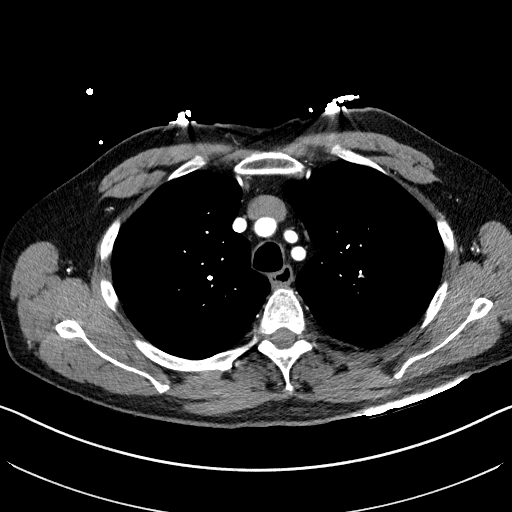
[im 102/117  lung]
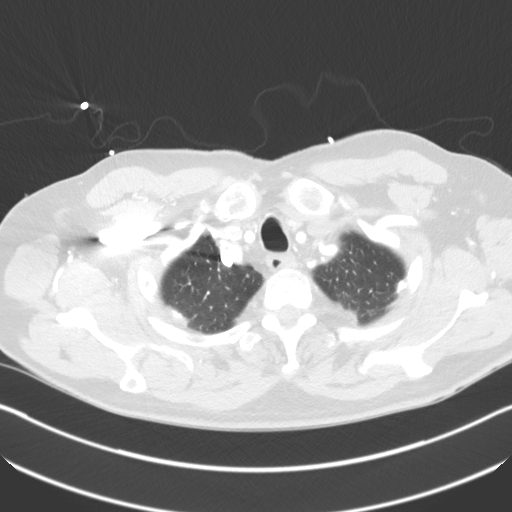

[Series 6: lung · axial · 0.70mm/px · z∈[-407,-113]mm · 8 of 175 slices shown]
[im 14/175  mediastinal]
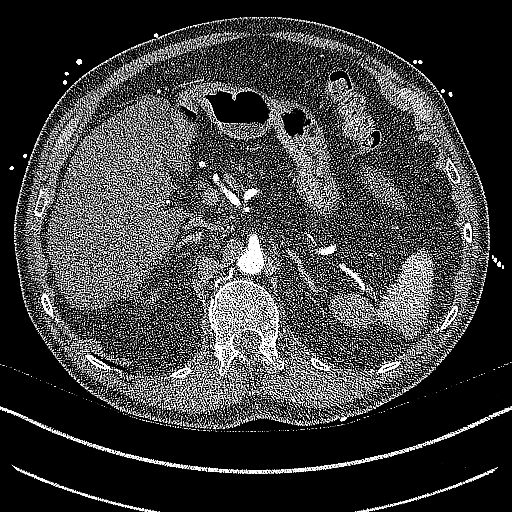
[im 41/175  mediastinal]
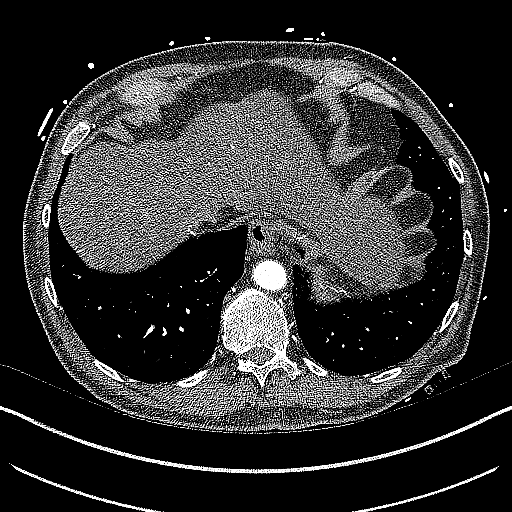
[im 54/175  mediastinal]
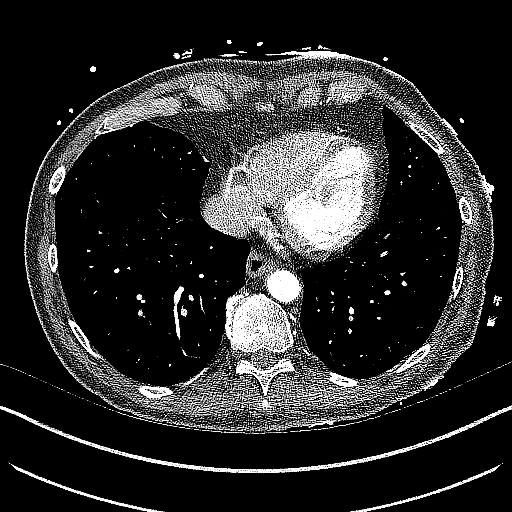
[im 81/175  mediastinal]
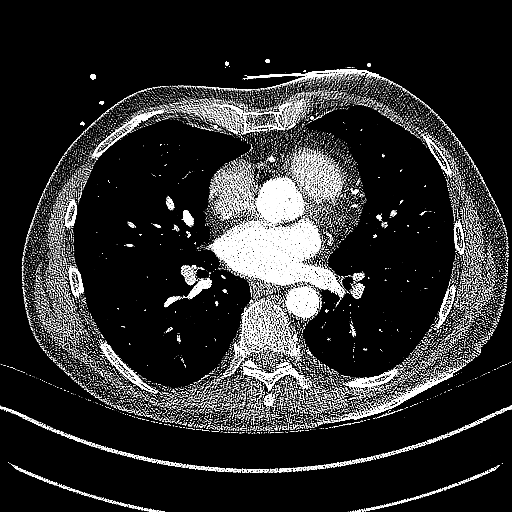
[im 94/175  mediastinal]
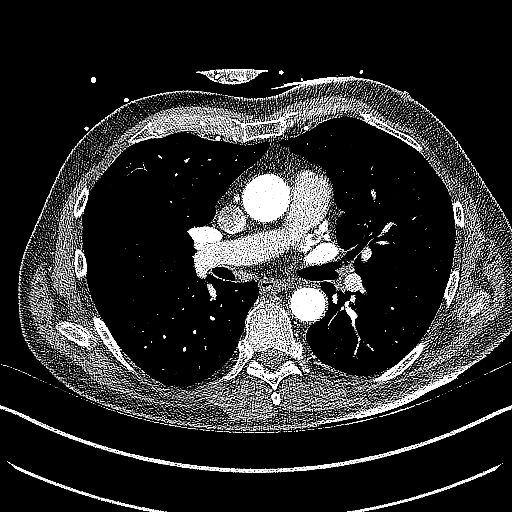
[im 121/175  mediastinal]
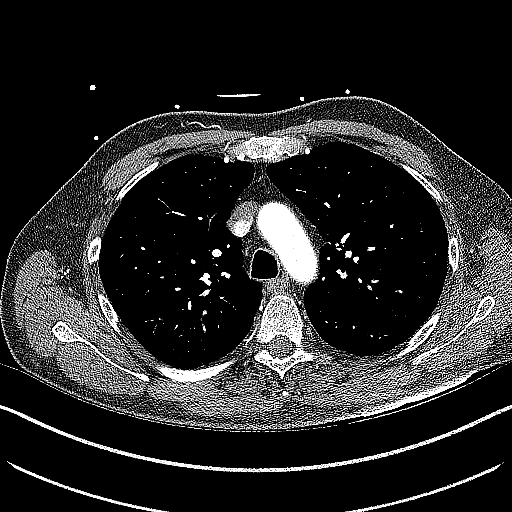
[im 134/175  mediastinal]
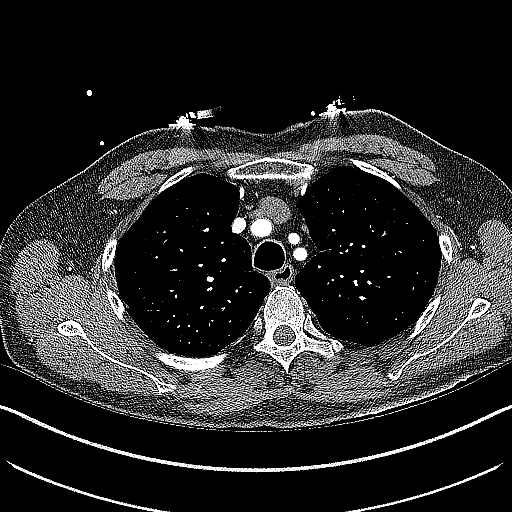
[im 161/175  mediastinal]
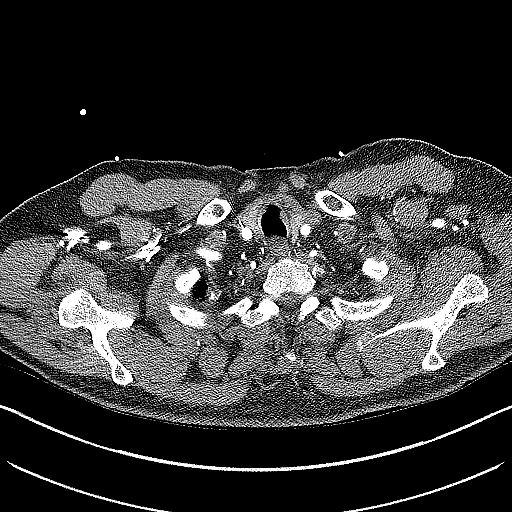

[Series 8: coronals · coronal · 0.71mm/px · 1 of 150 slices shown]
[im 75/150  mediastinal]
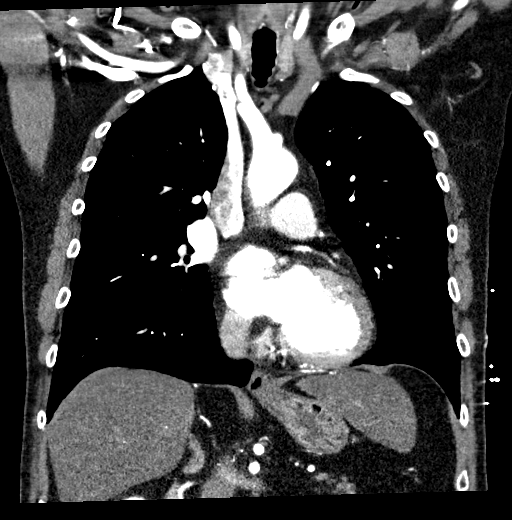

[19 of 36 positions shown; findings below may reference images not displayed]

FINDINGS: Cardiovascular:

Heart:

No cardiomegaly. No pericardial fluid/thickening. No significant
coronary calcifications.

Aorta:

Unremarkable course, caliber, contour of the thoracic aorta. No
aneurysm or dissection flap. No periaortic fluid.

Pulmonary arteries:

The CT is not protocol for the most sensitive evaluation of the
pulmonary arteries, however, there is no filling defect of the main
pulmonary artery or the lobar pulmonary arteries.

Mediastinum/Nodes: No mediastinal adenopathy. Unremarkable
appearance of the thoracic esophagus.

Unremarkable thoracic inlet

Lungs/Pleura: Central airways are clear. No pleural effusion. No
confluent airspace disease.

No pneumothorax.

Upper Abdomen: Hiatal hernia. No acute finding of the upper abdomen.
Diffusely decreased attenuation of the liver parenchyma.

Musculoskeletal: No acute displaced fracture. Degenerative changes
of the spine.

Review of the MIP images confirms the above findings.

.
IMPRESSION: Negative for acute aortic syndrome.

No acute finding of the chest.

Hiatal hernia.

## 2020-03-31 NOTE — Progress Notes (Signed)
Office Visit    Patient Name: Gerald BERGESON Sr. Date of Encounter: 04/01/2020  Primary Care Provider:  Idelle Crouch, MD Primary Cardiologist:  Ida Rogue, MD Electrophysiologist:  None   Chief Complaint    Gerald Frisk Sr. is a 79 y.o. male with a hx of tobacco use, DM2, HLD, CAD presents today for follow up of CAD   Past Medical History    Past Medical History:  Diagnosis Date  . CAD (coronary artery disease)    a/ NSTEMI 10/19: LHC 10/19: LM w/o sig dz, mLAD 60-70%, LCx w/o sig dz, RCA w/o sig dz, ost ramus 100% s/p PCI/DES, EF 55%  . Carotid artery disease ([place])    a/ s/p R CEA  . COPD (chronic obstructive pulmonary disease) ([place])   . Diabetes mellitus with complication ([place])   . Essential hypertension   . Fibromyalgia   . HLD (hyperlipidemia)   . Polyneuropathy   . Tubular adenoma of colon    Past Surgical History:  Procedure Laterality Date  . CAROTID ENDARTERECTOMY Right   . CATARACT EXTRACTION W/PHACO Left 02/02/2018   Procedure: CATARACT EXTRACTION PHACO AND INTRAOCULAR LENS PLACEMENT ([place]) LEFT DIABETIC;  Surgeon: Leandrew Koyanagi, MD;  Location: [place];  Service: Ophthalmology;  Laterality: Left;  DIABETIC  . COLONOSCOPY N/A 07/21/2016   Procedure: COLONOSCOPY;  Surgeon: Lollie Sails, MD;  Location: [facility] ENDOSCOPY;  Service: Endoscopy;  Laterality: N/A;  CBC; PT/ INR  . CORONARY STENT INTERVENTION N/A 07/08/2018   Procedure: CORONARY STENT INTERVENTION;  Surgeon: Wellington Hampshire, MD;  Location: [place] [facility];  Service: Cardiovascular;  Laterality: N/A;  . EYE SURGERY    . LEFT HEART CATH AND CORONARY ANGIOGRAPHY Right 07/08/2018   Procedure: LEFT HEART CATH AND CORONARY ANGIOGRAPHY possible PCI;  Surgeon: Minna Merritts, MD;  Location: [place] [facility];  Service: Cardiovascular;  Laterality: Right;    Allergies  Allergies  Allergen Reactions  . Cephalexin Rash  . Penicillins Rash    Has patient had a  PCN reaction causing immediate rash, facial/tongue/throat swelling, SOB or lightheadedness with hypotension: Unknown Has patient had a PCN reaction causing severe rash involving mucus membranes or skin necrosis: Unknown Has patient had a PCN reaction that required hospitalization: Unknown Has patient had a PCN reaction occurring within the last 10 years: Unknown If all of the above answers are "NO", then may proceed with Cephalosporin use.     History of Present Illness    Gerald MONFORTE Sr. is a 79 y.o. male with a hx of prior  tobacco use, DM2, HLD, CAD. He was last seen 10/2018 by Dr. Rockey Situ.  Presented to [facility]/2018 with unstable angina and NSTEMI. Underwent PCi/DES to ramus intermedius. Complicated by no reflow due to distal embolization which was successfully treated with intracoronary adenosine. Echo 07/2018 with LVEF 66-59%, diatolic dysfunction, mild left atrial enlargement.   At clinic visit 10/2018 noted he has been out of his medications for one month and his primary care provider refilled. He had some atypical chest pain which was deemed musculoskeletal.  Lipid panel 03/07/20 via [facility]:  Total choelsterol 138, triglycerides 119, HDL 54.5, LDL 60  Present today with his wife. Reports feeling overall well. Does not monitor his BP at home, BP at office visit with PCP recently 160/80, but better controlled today. Discussed purchasing cuff for home monitoring. Endorses some lightheadedness with position changes  - discussed orthostatic hypotension and remaining well hydrated. Denies  near-syncope, syncope.   Reports some dyspnea with more than usual activity which he tells me is stable and happens mostly in the heat.   Reports no shortness of breath. Reports no chest pain, pressure, or tightness. No edema, orthopnea, PND. Reports no palpitations.    EKGs/Labs/Other Studies Reviewed:   The following studies were reviewed today:  EKG:  EKG is ordered today.  The ekg  ordered today demonstrates SB 53 bpm with no acute ST/T wave changes.   Recent Labs: No results found for requested labs within last 8760 hours.  Recent Lipid Panel    Component Value Date/Time   CHOL 135 07/18/2018 0853   TRIG 127 07/18/2018 0853   HDL 49 07/18/2018 0853   CHOLHDL 2.8 07/18/2018 0853   LDLCALC 61 07/18/2018 0853    Home Medications   No outpatient medications have been marked as taking for the 04/01/20 encounter (Appointment) with Loel Dubonnet, NP.      Review of Systems      Review of Systems  Constitutional: Negative for chills, fever and malaise/fatigue.  Cardiovascular: Negative for chest pain, dyspnea on exertion, leg swelling, near-syncope, orthopnea, palpitations and syncope.  Respiratory: Negative for cough, shortness of breath and wheezing.   Gastrointestinal: Negative for nausea and vomiting.  Neurological: Positive for light-headedness. Negative for dizziness and weakness.   All other systems reviewed and are otherwise negative except as noted above.  Physical Exam    VS:  There were no vitals taken for this visit. , BMI There is no height or weight on file to calculate BMI. GEN: Well nourished, well developed, in no acute distress. HEENT: normal. Neck: Supple, no JVD, carotid bruits, or masses. Cardiac: bradycardic, RRR, no murmurs, rubs, or gallops. No clubbing, cyanosis, edema.  Radials/DP/PT 2+ and equal bilaterally.  Respiratory:  Respirations regular and unlabored, clear to auscultation bilaterally. GI: Soft, nontender, nondistended, BS + x 4. MS: No deformity or atrophy. Skin: Warm and dry, no rash. Neuro:  Strength and sensation are intact. Psych: Normal affect.   Assessment & Plan    1. CAD -stable no anginal symptoms.  EKG with no acute ST/T wave changes.  Medication for ischemic evaluation at this time.  Continue GDMT including aspirin, statin.  No beta-blocker due to baseline bradycardia.  He has been taking Brilinta 90 mg  daily and is greater than 2 years from his event.  Stop Brilinta, start Plavix 75 mg daily.  Continue low-sodium, heart healthy diet.  Continue regular cardiovascular exercise.  2. Bradycardia - HR 53 bpm.  Longstanding history of bradycardia.  Denies near syncope nor syncope.  Does report some lightheadedness which on description is orthostatic hypotension is associated with position changes.  No indication for cardiac monitoring at this time.  Avoid AV nodal blocking agents.  3. DM2 - 03/07/20 A1c of 6.7. Continue to follow with PCP.   4. HLD, LDL goal <70 - Lipid panel 03/07/20 with total cholesterol 138, triglyceride 119, HDL 54.5, LDL 60.  Continue atorvastatin 80 mg daily.  Disposition: Follow up in 6 month(s) with Dr. Rockey Situ or APP   Loel Dubonnet, NP 04/01/2020, 7:28 AM

## 2020-04-01 ENCOUNTER — Other Ambulatory Visit: Payer: Self-pay

## 2020-04-01 ENCOUNTER — Encounter: Payer: Self-pay | Admitting: Family

## 2020-04-01 ENCOUNTER — Ambulatory Visit: Payer: Medicare PPO | Admitting: Family

## 2020-04-01 VITALS — BP 134/78 | HR 53 | Ht 70.0 in | Wt 173.2 lb

## 2020-04-01 DIAGNOSIS — E785 Hyperlipidemia, unspecified: Secondary | ICD-10-CM | POA: Diagnosis not present

## 2020-04-01 DIAGNOSIS — I10 Essential (primary) hypertension: Secondary | ICD-10-CM | POA: Diagnosis not present

## 2020-04-01 DIAGNOSIS — I25118 Atherosclerotic heart disease of native coronary artery with other forms of angina pectoris: Secondary | ICD-10-CM

## 2020-04-01 DIAGNOSIS — R001 Bradycardia, unspecified: Secondary | ICD-10-CM

## 2020-04-01 MED ORDER — CLOPIDOGREL BISULFATE 75 MG PO TABS: 75.0000 mg | ORAL_TABLET | Freq: Every day | ORAL | 3 refills | Status: DC

## 2020-04-01 NOTE — Patient Instructions (Addendum)
Medication Instructions:  Your physician has recommended you make the following change in your medication:   When you run out of [place]...  START Clopidogrel (Plavix) 75mg  daily  *If you need a refill on your cardiac medications before your next appointment, please call your pharmacy*   Lab Work: No lab work today. Your recent cholesterol numbers looked good.   Testing/Procedures: Your EKG today shows sinus bradycardia which is a slow but regular heart rhythm.   Follow-Up: At [facility], you and your health needs are our priority.  As part of our continuing mission to provide you with exceptional heart care, we have created designated Provider Care Teams.  These Care Teams include your primary Cardiologist (physician) and Advanced Practice Providers (APPs -  Physician Assistants and Nurse Practitioners) who all work together to provide you with the care you need, when you need it.  We recommend signing up for the patient portal called "MyChart".  Sign up information is provided on this After Visit Summary.  MyChart is used to connect with patients for Virtual Visits (Telemedicine).  Patients are able to view lab/test results, encounter notes, upcoming appointments, etc.  Non-urgent messages can be sent to your provider as well.   To learn more about what you can do with MyChart, go to [email or web address].    Your next appointment:   6 month(s)  The format for your next appointment:   In Person  Provider:   You may see Ida Rogue, MD or one of the following Advanced Practice Providers on your designated Care Team:    Murray Hodgkins, NP  Christell Faith, PA-C  Marrianne Mood, PA-C  Laurann Montana, NP  Other Instructions   Recommend purchasing an arm blood pressure cuff. Recommend checking blood pressure once or twice per week. If you notice your blood pressure is consistently more than 130/80, please let us or Dr. Doy Hutching know.   Orthostatic Hypotension Blood  pressure is a measurement of how strongly, or weakly, your blood is pressing against the walls of your arteries. Orthostatic hypotension is a sudden drop in blood pressure that happens when you quickly change positions, such as when you get up from sitting or lying down. Arteries are blood vessels that carry blood from your heart throughout your body. When blood pressure is too low, you may not get enough blood to your brain or to the rest of your organs. This can cause weakness, light-headedness, rapid heartbeat, and fainting. This can last for just a few seconds or for up to a few minutes. Orthostatic hypotension is usually not a serious problem. However, if it happens frequently or gets worse, it may be a sign of something more serious. What are the causes? This condition may be caused by:  Sudden changes in posture, such as standing up quickly after you have been sitting or lying down.  Blood loss.  Loss of body fluids (dehydration).  Heart problems.  Hormone (endocrine) problems.  Pregnancy.  Severe infection.  Lack of certain nutrients.  Severe allergic reactions (anaphylaxis).  Certain medicines, such as blood pressure medicine or medicines that make the body lose excess fluids (diuretics). Sometimes, this condition can be caused by not taking medicine as directed, such as taking too much of a certain medicine. What increases the risk? The following factors may make you more likely to develop this condition:  Age. Risk increases as you get older.  Conditions that affect the heart or the central nervous system.  Taking certain medicines, such as blood  pressure medicine or diuretics.  Being pregnant. What are the signs or symptoms? Symptoms of this condition may include:  Weakness.  Light-headedness.  Dizziness.  Blurred vision.  Fatigue.  Rapid heartbeat.  Fainting, in severe cases. How is this diagnosed? This condition is diagnosed based on:  Your medical  history.  Your symptoms.  Your blood pressure measurement. Your health care provider will check your blood pressure when you are: ? Lying down. ? Sitting. ? Standing. A blood pressure reading is recorded as two numbers, such as "120 over 80" (or 120/80). The first ("top") number is called the systolic pressure. It is a measure of the pressure in your arteries as your heart beats. The second ("bottom") number is called the diastolic pressure. It is a measure of the pressure in your arteries when your heart relaxes between beats. Blood pressure is measured in a unit called mm Hg. Healthy blood pressure for most adults is 120/80. If your blood pressure is below 90/60, you may be diagnosed with hypotension. Other information or tests that may be used to diagnose orthostatic hypotension include:  Your other vital signs, such as your heart rate and temperature.  Blood tests.  Tilt table test. For this test, you will be safely secured to a table that moves you from a lying position to an upright position. Your heart rhythm and blood pressure will be monitored during the test. How is this treated? This condition may be treated by:  Changing your diet. This may involve eating more salt (sodium) or drinking more water.  Taking medicines to raise your blood pressure.  Changing the dosage of certain medicines you are taking that might be lowering your blood pressure.  Wearing compression stockings. These stockings help to prevent blood clots and reduce swelling in your legs. In some cases, you may need to go to the hospital for:  Fluid replacement. This means you will receive fluids through an IV.  Blood replacement. This means you will receive donated blood through an IV (transfusion).  Treating an infection or heart problems, if this applies.  Monitoring. You may need to be monitored while medicines that you are taking wear off. Follow these instructions at home: Eating and  drinking   Drink enough fluid to keep your urine pale yellow.  Eat a healthy diet, and follow instructions from your health care provider about eating or drinking restrictions. A healthy diet includes: ? Fresh fruits and vegetables. ? Whole grains. ? Lean meats. ? Low-fat dairy products.  Eat extra salt only as directed. Do not add extra salt to your diet unless your health care provider told you to do that.  Eat frequent, small meals.  Avoid standing up suddenly after eating. Medicines  Take over-the-counter and prescription medicines only as told by your health care provider. ? Follow instructions from your health care provider about changing the dosage of your current medicines, if this applies. ? Do not stop or adjust any of your medicines on your own. General instructions   Wear compression stockings as told by your health care provider.  Get up slowly from lying down or sitting positions. This gives your blood pressure a chance to adjust.  Avoid hot showers and excessive heat as directed by your health care provider.  Return to your normal activities as told by your health care provider. Ask your health care provider what activities are safe for you.  Do not use any products that contain nicotine or tobacco, such as cigarettes, e-cigarettes, and  chewing tobacco. If you need help quitting, ask your health care provider.  Keep all follow-up visits as told by your health care provider. This is important. Contact a health care provider if you:  Vomit.  Have diarrhea.  Have a fever for more than 2-3 days.  Feel more thirsty than usual.  Feel weak and tired. Get help right away if you:  Have chest pain.  Have a fast or irregular heartbeat.  Develop numbness in any part of your body.  Cannot move your arms or your legs.  Have trouble speaking.  Become sweaty or feel light-headed.  Faint.  Feel short of breath.  Have trouble staying awake.  Feel  confused. Summary  Orthostatic hypotension is a sudden drop in blood pressure that happens when you quickly change positions.  Orthostatic hypotension is usually not a serious problem.  It is diagnosed by having your blood pressure taken lying down, sitting, and then standing.  It may be treated by changing your diet or adjusting your medicines. This information is not intended to replace advice given to you by your health care provider. Make sure you discuss any questions you have with your health care provider. Document Revised: 03/03/2018 Document Reviewed: 03/03/2018 Elsevier Patient Education  [place].

## 2020-09-30 NOTE — Progress Notes (Signed)
Cardiology Office Note  Date:  10/01/2020   ID:  Gerald Specter Sr., DOB [birth year], MRN [id number]  PCP:  Marguarite Arbour, MD   Chief Complaint  Patient presents with  . Follow-up    6 month F/U-Patient reports getting fluctuating blood pressure readings at home.    HPI:  Mr. Gerald Mcgrath is a 80 year old gentleman with past medical history of smoking,  diabetes,  hyperlipidemia  presenting to the hospital October 2019 with unstable angina Troponin elevation  Pain relieved with sublingual nitro Nitropaste and heparin Brought to the cardiac catheterization lab given symptoms of unstable angina, non-STEMI Stent placed to ramus intermedius Who presents for follow-up of his coronary artery disease  Denies significant shortness of breath or chest pain No regular exercise but reports he is active  Checking his blood pressure at home, reports it is running high Some medication confusion, he pulled out his prescription slips, he is on carvedilol 3.125 twice daily Not on Brilinta, is on Plavix  Surgery on right wrist, skin cancer  Reports periodic fatigue  EKG personally reviewed by myself on todays visit NSR rate 50 beats per minute T wave abnormality V3 through V6, 1 and aVL  Other past medical history reviewed Cath 07/08/2018 Severe single-vessel disease of ramus branch, 100% occluded ostial/proximal region Moderate to severe mid LAD disease  Successful angioplasty and drug-eluting stent placement to the ramus intermedius.  This was complicated by no reflow due to distal embolization.  This was treated successfully with intracoronary adenosine.  echocardiogram July 28, 2018 results discussed with him in detail Normal ejection fraction 55 to 60%, diastolic dysfunction, mild left atrial enlargement otherwise normal study    PMH:   has a past medical history of CAD (coronary artery disease), Cancer (HCC), Carotid artery disease (HCC), COPD (chronic obstructive  pulmonary disease) (HCC), Diabetes mellitus with complication (HCC), Essential hypertension, Fibromyalgia, HLD (hyperlipidemia), Polyneuropathy, and Tubular adenoma of colon.  PSH:    Past Surgical History:  Procedure Laterality Date  . CAROTID ENDARTERECTOMY Right   . CATARACT EXTRACTION W/PHACO Left 02/02/2018   Procedure: CATARACT EXTRACTION PHACO AND INTRAOCULAR LENS PLACEMENT (IOC) LEFT DIABETIC;  Surgeon: Lockie Mola, MD;  Location: [facility];  Service: Ophthalmology;  Laterality: Left;  DIABETIC  . COLONOSCOPY N/A 07/21/2016   Procedure: COLONOSCOPY;  Surgeon: Christena Deem, MD;  Location: [facility];  Service: Endoscopy;  Laterality: N/A;  CBC; PT/ INR  . CORONARY STENT INTERVENTION N/A 07/08/2018   Procedure: CORONARY STENT INTERVENTION;  Surgeon: Iran Ouch, MD;  Location: [facility] INVASIVE CV LAB;  Service: Cardiovascular;  Laterality: N/A;  . EYE SURGERY    . LEFT HEART CATH AND CORONARY ANGIOGRAPHY Right 07/08/2018   Procedure: LEFT HEART CATH AND CORONARY ANGIOGRAPHY possible PCI;  Surgeon: Antonieta Iba, MD;  Location: [facility] INVASIVE CV LAB;  Service: Cardiovascular;  Laterality: Right;  . SKIN CANCER EXCISION      Current Outpatient Medications  Medication Sig Dispense Refill  . acetaminophen (TYLENOL) 500 MG tablet Take 1,000 mg by mouth every 6 (six) hours as needed for mild pain.     Marland Kitchen aspirin 81 MG tablet Take 81 mg by mouth daily.    Marland Kitchen atorvastatin (LIPITOR) 80 MG tablet Take 1 tablet (80 mg total) by mouth daily. 30 tablet 1  . clopidogrel (PLAVIX) 75 MG tablet Take 1 tablet (75 mg total) by mouth daily. 90 tablet 3  . glimepiride (AMARYL) 2 MG tablet Take 2 mg by mouth daily with  breakfast.    . omeprazole (PRILOSEC) 20 MG capsule Take 20 mg by mouth daily.     No current facility-administered medications for this visit.  On today's visit reports he is taking carvedilol 3.125 twice daily  Allergies:   Cephalexin and Penicillins    Social History:  The patient  reports that he quit smoking about 12 years ago. He smoked 3.00 packs per day. He uses smokeless tobacco. He reports previous alcohol use. He reports that he does not use drugs.   Family History:   family history includes Heart Problems in his brother, mother, and sister; Heart attack in his brother; Heart block in his brother and sister.    Review of Systems: Review of Systems  Constitutional: Positive for malaise/fatigue.  Respiratory: Negative.   Cardiovascular: Negative.   Gastrointestinal: Negative.   Musculoskeletal: Negative.   Neurological: Negative.   Psychiatric/Behavioral: Negative.   All other systems reviewed and are negative.   PHYSICAL EXAM: VS:  BP (!) 170/88 (BP Location: Left Arm, Patient Position: Sitting, Cuff Size: Normal)   Pulse (!) 50   Ht 5\' 9"  (1.753 m)   Wt 178 lb (80.7 kg)   SpO2 98%   BMI 26.29 kg/m  , BMI Body mass index is 26.29 kg/m. GEN: Well nourished, well developed, in no acute distress  HEENT: normal  Neck: no JVD, carotid bruits, or masses Cardiac: RRR; no murmurs, rubs, or gallops,no edema  Respiratory:  clear to auscultation bilaterally, normal work of breathing GI: soft, nontender, nondistended, + BS MS: no deformity or atrophy  Skin: warm and dry, no rash Neuro:  Strength and sensation are intact Psych: euthymic mood, full affect  Recent Labs: No results found for requested labs within last 8760 hours.    Lipid Panel Lab Results  Component Value Date   CHOL 135 07/18/2018   HDL 49 07/18/2018   LDLCALC 61 07/18/2018   TRIG 127 07/18/2018      Wt Readings from Last 3 Encounters:  10/01/20 178 lb (80.7 kg)  04/01/20 173 lb 4 oz (78.6 kg)  10/24/18 174 lb 12 oz (79.3 kg)      ASSESSMENT AND PLAN:  Coronary artery disease of native artery of native heart with stable angina pectoris (HCC) - Plan: EKG 12-Lead Currently with no symptoms of angina. No further workup at this time. Continue  current medication regimen.  SOB (shortness of breath) Recommend regular walking program Some fatigue, uncertain if this is exacerbated by bradycardia Recommend he stop the carvedilol  Mixed hyperlipidemia Recommend compliance with his Lipitor, goal LDL less than 70  Essential hypertension Blood pressure elevated at home per his numbers We will stop carvedilol secondary to bradycardia, fatigue, shortness of breath Will start losartan 100 daily He will call us with blood pressure measurements in the next 2 weeks    Total encounter time more than 25 minutes  Greater than 50% was spent in counseling and coordination of care with the patient    Orders Placed This Encounter  Procedures  . EKG 12-Lead     Signed, Dossie Arbour, M.D., Ph.D. 10/01/2020  [facility] [place], [place] [phone]

## 2020-10-01 ENCOUNTER — Encounter: Payer: Self-pay | Admitting: Cardiovascular Disease

## 2020-10-01 ENCOUNTER — Ambulatory Visit: Payer: Medicare PPO | Admitting: Cardiovascular Disease

## 2020-10-01 ENCOUNTER — Other Ambulatory Visit: Payer: Self-pay

## 2020-10-01 VITALS — BP 170/88 | HR 50 | Ht 69.0 in | Wt 178.0 lb

## 2020-10-01 DIAGNOSIS — E782 Mixed hyperlipidemia: Secondary | ICD-10-CM

## 2020-10-01 DIAGNOSIS — R0602 Shortness of breath: Secondary | ICD-10-CM

## 2020-10-01 DIAGNOSIS — I25118 Atherosclerotic heart disease of native coronary artery with other forms of angina pectoris: Secondary | ICD-10-CM | POA: Diagnosis not present

## 2020-10-01 DIAGNOSIS — E785 Hyperlipidemia, unspecified: Secondary | ICD-10-CM | POA: Diagnosis not present

## 2020-10-01 DIAGNOSIS — I10 Essential (primary) hypertension: Secondary | ICD-10-CM | POA: Diagnosis not present

## 2020-10-01 DIAGNOSIS — R001 Bradycardia, unspecified: Secondary | ICD-10-CM | POA: Diagnosis not present

## 2020-10-01 MED ORDER — LOSARTAN POTASSIUM 100 MG PO TABS: 100.0000 mg | ORAL_TABLET | Freq: Every day | ORAL | 3 refills | Status: DC

## 2020-10-01 MED ORDER — LOSARTAN POTASSIUM 50 MG PO TABS: 50.0000 mg | ORAL_TABLET | Freq: Every day | ORAL | 3 refills | Status: DC

## 2020-10-01 NOTE — Patient Instructions (Addendum)
Medication Instructions:  Please start losartan 100 mg daily  Stop carvedilol/coreg, this can slow your heart beat  If you need a refill on your cardiac medications before your next appointment, please call your pharmacy.    Lab work: No new labs needed   If you have labs (blood work) drawn today and your tests are completely normal, you will receive your results only by: Marland Kitchen MyChart Message (if you have MyChart) OR . A paper copy in the mail If you have any lab test that is abnormal or we need to change your treatment, we will call you to review the results.   Testing/Procedures: No new testing needed   Follow-Up: At [facility], you and your health needs are our priority.  As part of our continuing mission to provide you with exceptional heart care, we have created designated Provider Care Teams.  These Care Teams include your primary Cardiologist (physician) and Advanced Practice Providers (APPs -  Physician Assistants and Nurse Practitioners) who all work together to provide you with the care you need, when you need it.  . You will need a follow up appointment in 12 months  . Providers on your designated Care Team:   . Gerald Hodgkins, NP . Gerald Faith, PA-C . Gerald Mood, PA-C  Any Other Special Instructions Will Be Listed Below (If Applicable).  COVID-19 Vaccine Information can be found at: ShippingScam.co.uk For questions related to vaccine distribution or appointments, please email [email or web address] or call [phone].

## 2021-05-14 ENCOUNTER — Other Ambulatory Visit: Payer: Self-pay | Admitting: Family

## 2021-09-08 ENCOUNTER — Telehealth: Payer: Self-pay | Admitting: Cardiovascular Disease

## 2021-09-08 ENCOUNTER — Other Ambulatory Visit: Payer: Self-pay | Admitting: Cardiovascular Disease

## 2021-09-08 NOTE — Telephone Encounter (Signed)
Attempted to schedule.  No ans no vm  

## 2021-09-08 NOTE — Telephone Encounter (Signed)
Please call to discuss Losartan doseage.

## 2021-09-08 NOTE — Telephone Encounter (Signed)
Was able to reach back out to pharmacist Threasa Beards who reports there was a discretion in pt's Losartan refill. Pharmacist reports since 10/01/2020 he has been taking Losartan 50 mg daily. She was able to report on same day of pt's last OV with Dr. Rockey Situ she got two med request sent in from our office "within minutes apart".   They received  Losartan 50 mg daily Losartan 100 mg daily.   Dr. Donivan Scull last [place] notes from 10/01/2020 Essential hypertension Blood pressure elevated at home per his numbers We will stop carvedilol secondary to bradycardia, fatigue, shortness of breath Will start losartan 100 daily  Pharmacist stated since Jan (almost a year ago) pt has only been getting refills for Losartan 50 mg daily "and he seems to be doing fine on that. Has had no complaints or BP issues:. Advised since he has not increased his does of losartan since Jan, keep current dosing that has been refilled since Jan of Losartan 50 mg daily.   At next appt if dose is change again, will be certain to send message in scrip to cancel any existing orders for losartan so this mix up will not happen again. Pt nneds to be schedule for an appt as a 12 month f/u next month as Jan will be one year.   Mwlanie is thankful for the return call and will look for any changes in refills at next OV.

## 2022-01-18 NOTE — Progress Notes (Signed)
?Cardiology Office Note:   ? ?Date:  01/19/2022  ? ?ID:  Gerald Frisk Sr., DOB [birth year], MRN [id number] ? ?PCP:  Idelle Crouch, MD  ?[facility] Cardiologist:  Ida Rogue, MD  ?[facility] Electrophysiologist:  None  ? ?Referring MD: Idelle Crouch, MD  ? ?Chief Complaint: Shortness of breath and fatigue ? ?History of Present Illness:   ? ?Gerald Frisk Sr. is a 81 y.o. male with a hx of tobacco use, DM2, HLD, CAD who presents for follow-up. ? ?Presented to [facility] 06/2018 with unstable angina and NSTEMI. Underwent PCi/DES to ramus intermedius. Complicated by no reflow due to distal embolization which was successfully treated with intracoronary adenosine. Echo 07/2018 with LVEF 40-98%, diatolic dysfunction, mild left atrial enlargement.  ? ?Last seen in 2021 and coreg was stopped 2/2 to bradycardia and he was started on losartan. ? ?Today, the patient reports fatigue and shortness of breath. Have been off and on for the last year. Has not been progressing. Breathing is worse with exertion. No chest pain, LLE, orthopnea, pnd. Has some dizzy spells at times, worse with standing up. Has some lightheadedness. No recent illness, fever, chills, nausea, vomiting.  ? ?Past Medical History:  ?Diagnosis Date  ? CAD (coronary artery disease)   ? a/ NSTEMI 10/19: [place] 10/19: LM w/o sig dz, mLAD 60-70%, LCx w/o sig dz, RCA w/o sig dz, ost ramus 100% s/p PCI/DES, EF 55%  ? Cancer [facility])   ? Carotid artery disease ([place])   ? a/ s/p R CEA  ? COPD (chronic obstructive pulmonary disease) ([place])   ? Diabetes mellitus with complication ([place])   ? Essential hypertension   ? Fibromyalgia   ? HLD (hyperlipidemia)   ? Polyneuropathy   ? Tubular adenoma of colon   ? ? ?Past Surgical History:  ?Procedure Laterality Date  ? CAROTID ENDARTERECTOMY Right   ? CATARACT EXTRACTION W/PHACO Left 02/02/2018  ? Procedure: CATARACT EXTRACTION PHACO AND INTRAOCULAR LENS PLACEMENT ([place]) LEFT DIABETIC;  Surgeon: Leandrew Koyanagi, MD;   Location: [place];  Service: Ophthalmology;  Laterality: Left;  DIABETIC  ? COLONOSCOPY N/A 07/21/2016  ? Procedure: COLONOSCOPY;  Surgeon: Lollie Sails, MD;  Location: [facility] ENDOSCOPY;  Service: Endoscopy;  Laterality: N/A;  CBC; PT/ INR  ? CORONARY STENT INTERVENTION N/A 07/08/2018  ? Procedure: CORONARY STENT INTERVENTION;  Surgeon: Wellington Hampshire, MD;  Location: [place] CV LAB;  Service: Cardiovascular;  Laterality: N/A;  ? EYE SURGERY    ? LEFT HEART CATH AND CORONARY ANGIOGRAPHY Right 07/08/2018  ? Procedure: LEFT HEART CATH AND CORONARY ANGIOGRAPHY possible PCI;  Surgeon: Minna Merritts, MD;  Location: [place] CV LAB;  Service: Cardiovascular;  Laterality: Right;  ? SKIN CANCER EXCISION    ? ? ?Current Medications: ?Current Meds  ?Medication Sig  ? acetaminophen (TYLENOL) 500 MG tablet Take 1,000 mg by mouth every 6 (six) hours as needed for mild pain.   ? amLODipine (NORVASC) 5 MG tablet Take 1 tablet by mouth daily.  ? aspirin 81 MG tablet Take 81 mg by mouth daily.  ? atorvastatin (LIPITOR) 80 MG tablet Take 1 tablet (80 mg total) by mouth daily.  ? glimepiride (AMARYL) 2 MG tablet Take 2 mg by mouth daily with breakfast.  ? triamcinolone ointment (KENALOG) 0.5 % Apply 1 application. topically in the morning and at bedtime.  ? [DISCONTINUED] clopidogrel (PLAVIX) 75 MG tablet Take 1 tablet (75 mg total) by mouth daily.  ? [DISCONTINUED] losartan (COZAAR)  100 MG tablet Take 1 tablet (100 mg total) by mouth daily.  ?  ? ?Allergies:   Cephalexin and Penicillins  ? ?Social History  ? ?Socioeconomic History  ? Marital status: Single  ?  Spouse name: Not on file  ? Number of children: Not on file  ? Years of education: Not on file  ? Highest education level: Not on file  ?Occupational History  ? Not on file  ?Tobacco Use  ? Smoking status: Former  ?  Packs/day: 3.00  ?  Types: Cigarettes  ?  Quit date: 07/18/2008  ?  Years since quitting: 13.5  ? Smokeless tobacco: Current  ?   Types: Snuff  ?Vaping Use  ? Vaping Use: Never used  ?Substance and Sexual Activity  ? Alcohol use: Yes  ?  Comment: occasional beer or whiskey  ? Drug use: No  ? Sexual activity: Not on file  ?Other Topics Concern  ? Not on file  ?Social History Narrative  ? Not on file  ? ?Social Determinants of Health  ? ?Financial Resource Strain: Not on file  ?Food Insecurity: Not on file  ?Transportation Needs: Not on file  ?Physical Activity: Not on file  ?Stress: Not on file  ?Social Connections: Not on file  ?  ? ?Family History: ?The patient's family history includes Heart Problems in his brother, mother, and sister; Heart attack in his brother; Heart block in his brother and sister. ? ?ROS:   ?Please see the history of present illness.    ? All other systems reviewed and are negative. ? ?EKGs/Labs/Other Studies Reviewed:   ? ?The following studies were reviewed today: ? ?Echo 2019 ?Study Conclusions  ? ?- Left ventricle: The cavity size was normal. Systolic function was  ?  normal. The estimated ejection fraction was in the range of 55%  ?  to 60%. Wall motion was normal; there were no regional wall  ?  motion abnormalities. Doppler parameters are consistent with  ?  abnormal left ventricular relaxation (grade 1 diastolic  ?  dysfunction).  ?- Left atrium: The atrium was mildly dilated.  ?- Right ventricle: Systolic function was normal.  ?- Pulmonary arteries: Systolic pressure was within the normal  ?  range.  ? ?Cardiac cath 06/2018 ?  ?Mid LAD lesion is 60% stenosed. ?Ost Ramus to Ramus lesion is 100% stenosed. ?A drug-eluting stent was successfully placed using a STENT RESOLUTE ONYX 2.0X22. ?Post intervention, there is a 0% residual stenosis. ?  ?Successful angioplasty and drug-eluting stent placement to the ramus intermedius.  This was complicated by no reflow due to distal embolization.  This was treated successfully with intracoronary adenosine. ?  ?Recommendations: ?  ?Recommend uninterrupted dual antiplatelet  therapy with Aspirin '81mg'$  daily and Ticagrelor '90mg'$  twice daily for a minimum of 12 months (ACS - Class I recommendation).  ?I increase atorvastatin to 80 mg daily. ?Further FFR based revascularization of the LAD can be considered as an outpatient based on symptoms. ?  ? ?Coronary Diagrams ? ?Diagnostic ?Dominance: Right ? ? ? ? ?EKG:  EKG is  ordered today.  The ekg ordered today demonstrates Sinus bradycardia, 48bpm, short PRI, IVCD, no ST/T wave changes, poor baseline ? ?Recent Labs: ?No results found for requested labs within last 8760 hours.  ?Recent Lipid Panel ?   ?Component Value Date/Time  ? CHOL 135 07/18/2018 0853  ? TRIG 127 07/18/2018 0853  ? HDL 49 07/18/2018 0853  ? CHOLHDL 2.8 07/18/2018 0853  ? [place] 61  07/18/2018 0853  ? ? ? ?Physical Exam:   ? ?VS:  BP (!) 142/60 (BP Location: Left Arm, Patient Position: Sitting, Cuff Size: Large)   Pulse (!) 50   Ht '5\' 9"'$  (1.753 m)   Wt 175 lb (79.4 kg)   SpO2 98%   BMI 25.84 kg/m?    ? ?Wt Readings from Last 3 Encounters:  ?01/19/22 175 lb (79.4 kg)  ?10/01/20 178 lb (80.7 kg)  ?04/01/20 173 lb 4 oz (78.6 kg)  ?  ? ?GEN:  Well nourished, well developed in no acute distress ?HEENT: Normal ?NECK: No JVD; No carotid bruits ?LYMPHATICS: No lymphadenopathy ?CARDIAC: bradycardia, RR, no murmurs, rubs, gallops ?RESPIRATORY:  Clear to auscultation without rales, wheezing or rhonchi  ?ABDOMEN: Soft, non-tender, non-distended ?MUSCULOSKELETAL:  No edema; No deformity  ?SKIN: Warm and dry ?NEUROLOGIC:  Alert and oriented x 3 ?PSYCHIATRIC:  Normal affect  ? ?ASSESSMENT:   ? ?1. Dyspnea on exertion   ?2. Bradycardia   ?3. SOB (shortness of breath)   ?4. Hyperlipidemia, mixed   ?5. Essential hypertension   ?6. Coronary artery disease involving native coronary artery of native heart without angina pectoris   ?7. Other fatigue   ? ?PLAN:   ? ?In order of problems listed above: ? ?SOB/fatigue ?Reports symptoms are intermittent over the last year, not progressive.No chest  pain, LLE, orthopnea, pnd. Recent labs by PCP unremarkable. He is bradycardic, 49bpm on EKG.  HE is off AV nodal blocking agents. He reports some dizziness and lightheadedness. Orthostatics negative today. I wi

## 2022-01-19 ENCOUNTER — Other Ambulatory Visit
Admission: RE | Admit: 2022-01-19 | Discharge: 2022-01-19 | Disposition: A | Discharge status: Home / Self Care | Payer: Medicare PPO | Source: Ambulatory Visit | Attending: Medical | Admitting: Medical

## 2022-01-19 ENCOUNTER — Ambulatory Visit: Payer: Medicare PPO | Admitting: Medical

## 2022-01-19 ENCOUNTER — Ambulatory Visit (INDEPENDENT_AMBULATORY_CARE_PROVIDER_SITE_OTHER): Payer: Medicare PPO

## 2022-01-19 ENCOUNTER — Encounter: Payer: Self-pay | Admitting: Medical

## 2022-01-19 VITALS — BP 142/60 | HR 50 | Ht 69.0 in | Wt 175.0 lb

## 2022-01-19 DIAGNOSIS — I2510 Atherosclerotic heart disease of native coronary artery without angina pectoris: Secondary | ICD-10-CM

## 2022-01-19 DIAGNOSIS — R0609 Other forms of dyspnea: Secondary | ICD-10-CM

## 2022-01-19 DIAGNOSIS — R5383 Other fatigue: Secondary | ICD-10-CM

## 2022-01-19 DIAGNOSIS — R0602 Shortness of breath: Secondary | ICD-10-CM | POA: Diagnosis not present

## 2022-01-19 DIAGNOSIS — E782 Mixed hyperlipidemia: Secondary | ICD-10-CM | POA: Diagnosis not present

## 2022-01-19 DIAGNOSIS — R001 Bradycardia, unspecified: Secondary | ICD-10-CM

## 2022-01-19 DIAGNOSIS — I10 Essential (primary) hypertension: Secondary | ICD-10-CM

## 2022-01-19 LAB — MAGNESIUM: Magnesium: 2 mg/dL (ref 1.7–2.4)

## 2022-01-19 LAB — TSH: TSH: 3.814 u[IU]/mL (ref 0.350–4.500)

## 2022-01-19 MED ORDER — LOSARTAN POTASSIUM 100 MG PO TABS: 100.0000 mg | ORAL_TABLET | Freq: Every day | ORAL | 1 refills | Status: DC

## 2022-01-19 NOTE — Patient Instructions (Addendum)
Medication Instructions:  ?Your physician has recommended you make the following change in your medication:  ? ?STOP Plavix (clopidogrel)  ? ? ?Your Losartan has been refilled today. ? ?*If you need a refill on your cardiac medications before your next appointment, please call your pharmacy* ? ? ?Lab Work: ?Tsh and Magnesium labs today ? ?Please have your labs drawn at the [facility]. ?Stop at the Registration desk to check in ? ?If you have labs (blood work) drawn today and your tests are completely normal, you will receive your results only by: ?MyChart Message (if you have MyChart) OR ?A paper copy in the mail ?If you have any lab test that is abnormal or we need to change your treatment, we will call you to review the results. ? ? ?Testing/Procedures: ?Your physician has requested that you have an echocardiogram. Echocardiography is a painless test that uses sound waves to create images of your heart. It provides your doctor with information about the size and shape of your heart and how well your heart?s chambers and valves are working. This procedure takes approximately one hour. There are no restrictions for this procedure. ? ?Your provider has ordered a heart monitor to wear for 14 days. This will be mailed to your home with instructions on placement. Once you have finished the time frame requested, you will return monitor in box provided. ? ?  ? ? ?Follow-Up: ?At [facility], you and your health needs are our priority.  As part of our continuing mission to provide you with exceptional heart care, we have created designated Provider Care Teams.  These Care Teams include your primary Cardiologist (physician) and Advanced Practice Providers (APPs -  Physician Assistants and Nurse Practitioners) who all work together to provide you with the care you need, when you need it. ? ?We recommend signing up for the patient portal called "MyChart".  Sign up information is provided on this After Visit Summary.   MyChart is used to connect with patients for Virtual Visits (Telemedicine).  Patients are able to view lab/test results, encounter notes, upcoming appointments, etc.  Non-urgent messages can be sent to your provider as well.   ?To learn more about what you can do with MyChart, go to [email or web address].   ? ?Your next appointment:   ?6-8 week(s) ? ?The format for your next appointment:   ?In Person ? ?Provider:   ?You may see Ida Rogue, MD or one of the following Advanced Practice Providers on your designated Care Team:   ?Murray Hodgkins, NP ?Christell Faith, PA-C ?Cadence Kathlen Mody, PA-C  ? ? ?Other Instructions ? ? ?Important Information About Sugar ? ? ? ? ? ? ?

## 2022-01-20 ENCOUNTER — Telehealth: Payer: Self-pay

## 2022-01-20 NOTE — Telephone Encounter (Signed)
Called to give the patient lab results. Lmtcb.  

## 2022-01-20 NOTE — Telephone Encounter (Signed)
-----   Message from [place], PA-C sent at 01/20/2022  8:34 AM EDT ----- ?Mag and thyroid function normal ?

## 2022-01-22 DIAGNOSIS — R001 Bradycardia, unspecified: Secondary | ICD-10-CM | POA: Diagnosis not present

## 2022-02-05 ENCOUNTER — Ambulatory Visit (INDEPENDENT_AMBULATORY_CARE_PROVIDER_SITE_OTHER): Payer: Medicare PPO

## 2022-02-05 DIAGNOSIS — R0609 Other forms of dyspnea: Secondary | ICD-10-CM

## 2022-02-05 LAB — ECHOCARDIOGRAM COMPLETE
AR max vel: 2.71 cm2
AV Area VTI: 2.96 cm2
AV Area mean vel: 2.46 cm2
AV Mean grad: 3 mmHg
AV Peak grad: 4.8 mmHg
AV Vena cont: 0.3 cm
Ao pk vel: 1.1 m/s
Area-P 1/2: 4.12 cm2
S' Lateral: 3.1 cm

## 2022-02-06 ENCOUNTER — Telehealth: Payer: Self-pay

## 2022-02-06 NOTE — Telephone Encounter (Signed)
Called to give the patient echo results. Lmtcb. 

## 2022-02-06 NOTE — Telephone Encounter (Signed)
-----   Message from [place], PA-C sent at 02/06/2022  1:16 PM EDT ----- Echo showed normal pump function, diastolic dysfunction, normal wall motion, no significant valvular disease. Overall reassuring

## 2022-02-13 NOTE — Telephone Encounter (Signed)
Called the patient to give monitor results. Lmtcb.

## 2022-02-13 NOTE — Telephone Encounter (Signed)
-----   Message from [place], PA-C sent at 02/13/2022 10:12 AM EDT ----- Heart monitor showed predominately normal rhythm, 4 runs of fast heart rate, rare irregular beats. No significant arrhythmia. Average heart rate was 59bpm, which is reassuring. No heart block noted.

## 2022-02-17 NOTE — Telephone Encounter (Signed)
Several attempt made to reach the patient by telephone to review test results. Letter mailed to the patient to contact the office for results.

## 2022-03-09 NOTE — Progress Notes (Unsigned)
Cardiology Office Note  Date:  03/10/2022   ID:  [name], DOB 18-Aug-1941, MRN [id number]  PCP:  Idelle Crouch, MD   Chief Complaint  Patient presents with   6-8 week follow up     "Doing well." Medications reviewed by the patient verbally.     HPI:  Mr. Gerald Mcgrath is a 81 year old gentleman with past medical history of smoking, COPD diabetes,  hyperlipidemia  presenting to the hospital October 2019 with unstable angina Troponin elevation  Pain relieved with sublingual nitro Nitropaste and heparin Brought to the cardiac catheterization lab given symptoms of unstable angina, non-STEMI CAD, Stent placed to ramus intermedius Asymptomatic bradycardia Who presents for follow-up of his coronary artery disease  Last seen in clinic by myself January 2022 SOB on exertion, no regular exercise program Rides a truck or the lawnmower down to the mailbox No regular walking program  Blood pressure elevated today and on last clinic visit, did not take medications this morning does not check blood pressure at home  Denies chest pain concerning for angina, no leg swelling or PND no near-syncope or syncope  Heart rates low today 48 bpm, reports he is asymptomatic Heart rate chronically low running 47 up to 50 over the past several years  EKG personally reviewed by myself on todays visit NSR rate 48 beats per minute no significant ST-T wave changes  Other past medical history reviewed Cath 07/08/2018 Severe single-vessel disease of ramus branch, 100% occluded ostial/proximal region Moderate to severe mid LAD disease, 60%  Successful angioplasty and drug-eluting stent placement to the ramus intermedius.  This was complicated by no reflow due to distal embolization.  This was treated successfully with intracoronary adenosine.  echocardiogram July 28, 2018 results discussed with him in detail Normal ejection fraction 55 to 10%, diastolic dysfunction, mild left atrial  enlargement otherwise normal study    PMH:   has a past medical history of CAD (coronary artery disease), Cancer ([place]), Carotid artery disease ([place]), COPD (chronic obstructive pulmonary disease) ([place]), Diabetes mellitus with complication ([place]), Essential hypertension, Fibromyalgia, HLD (hyperlipidemia), Polyneuropathy, and Tubular adenoma of colon.  PSH:    Past Surgical History:  Procedure Laterality Date   CAROTID ENDARTERECTOMY Right    CATARACT EXTRACTION W/PHACO Left 02/02/2018   Procedure: CATARACT EXTRACTION PHACO AND INTRAOCULAR LENS PLACEMENT ([place]) LEFT DIABETIC;  Surgeon: Leandrew Koyanagi, MD;  Location: [place];  Service: Ophthalmology;  Laterality: Left;  DIABETIC   COLONOSCOPY N/A 07/21/2016   Procedure: COLONOSCOPY;  Surgeon: Lollie Sails, MD;  Location[facility] ENDOSCOPY;  Service: Endoscopy;  Laterality: N/A;  CBC; PT/ INR   CORONARY STENT INTERVENTION N/A 07/08/2018   Procedure: CORONARY STENT INTERVENTION;  Surgeon: Wellington Hampshire, MD;  Location: [place] CV LAB;  Service: Cardiovascular;  Laterality: N/A;   EYE SURGERY     LEFT HEART CATH AND CORONARY ANGIOGRAPHY Right 07/08/2018   Procedure: LEFT HEART CATH AND CORONARY ANGIOGRAPHY possible PCI;  Surgeon: Minna Merritts, MD;  Location: [place] CV LAB;  Service: Cardiovascular;  Laterality: Right;   SKIN CANCER EXCISION      Current Outpatient Medications on File Prior to Visit  Medication Sig Dispense Refill   acetaminophen (TYLENOL) 500 MG tablet Take 1,000 mg by mouth every 6 (six) hours as needed for mild pain.      amLODipine (NORVASC) 5 MG tablet Take 1 tablet by mouth daily.     aspirin 81 MG tablet Take 81 mg by mouth daily.  atorvastatin (LIPITOR) 80 MG tablet Take 1 tablet (80 mg total) by mouth daily. 30 tablet 1   clopidogrel (PLAVIX) 75 MG tablet Take 1 tablet by mouth daily.     glimepiride (AMARYL) 2 MG tablet Take 2 mg by mouth daily with breakfast.     losartan  (COZAAR) 100 MG tablet Take 1 tablet (100 mg total) by mouth daily. 90 tablet 1   triamcinolone ointment (KENALOG) 0.5 % Apply 1 application. topically in the morning and at bedtime.     No current facility-administered medications on file prior to visit.     Allergies:   Cephalexin and Penicillins   Social History:  The patient  reports that he quit smoking about 13 years ago. His smoking use included cigarettes. He smoked an average of 3 packs per day. His smokeless tobacco use includes snuff. He reports current alcohol use. He reports that he does not use drugs.   Family History:   family history includes Heart Problems in his brother, mother, and sister; Heart attack in his brother; Heart block in his brother and sister.    Review of Systems: Review of Systems  Constitutional:  Positive for malaise/fatigue.  Respiratory: Negative.    Cardiovascular: Negative.   Gastrointestinal: Negative.   Musculoskeletal: Negative.   Neurological: Negative.   Psychiatric/Behavioral: Negative.    All other systems reviewed and are negative.   PHYSICAL EXAM: VS:  BP (!) 162/78 (BP Location: Left Arm, Patient Position: Sitting, Cuff Size: Normal)   Pulse (!) 48   Ht '5\' 9"'$  (1.753 m)   Wt 174 lb 2 oz (79 kg)   SpO2 99%   BMI 25.71 kg/m  , BMI Body mass index is 25.71 kg/m. Constitutional:  oriented to person, place, and time. No distress.  HENT:  Head: Grossly normal Eyes:  no discharge. No scleral icterus.  Neck: No JVD, no carotid bruits  Cardiovascular: Regular rate and rhythm, no murmurs appreciated Pulmonary/Chest: Clear to auscultation bilaterally, no wheezes or rails Abdominal: Soft.  no distension.  no tenderness.  Musculoskeletal: Normal range of motion Neurological:  normal muscle tone. Coordination normal. No atrophy Skin: Skin warm and dry Psychiatric: normal affect, pleasant  Recent Labs: 01/19/2022: Magnesium 2.0; TSH 3.814    Lipid Panel Lab Results  Component Value  Date   CHOL 135 07/18/2018   HDL 49 07/18/2018   LDLCALC 61 07/18/2018   TRIG 127 07/18/2018      Wt Readings from Last 3 Encounters:  03/10/22 174 lb 2 oz (79 kg)  01/19/22 175 lb (79.4 kg)  10/01/20 178 lb (80.7 kg)      ASSESSMENT AND PLAN:  Coronary artery disease of native artery of native heart with stable angina pectoris ([place]) - Plan: EKG 12-Lead Currently with no symptoms of angina. No further workup at this time.  We will hold the Plavix, stay on aspirin  Bradycardia Asymptomatic, running 48 today is running down to 47 and 50 over the past several years, denies symptoms Beta-blocker previously held  SOB (shortness of breath) Recommend walking program for conditioning He does have long smoking history, likely underlying COPD  Mixed hyperlipidemia On Lipitor 80, recommend he add Zetia 10 mg daily given LDL above goal  Essential hypertension Blood pressure elevated but did not take his medications this morning Was elevated on prior clinic visit as well Before we increase amlodipine up to 10 recommend he monitor blood pressure at home and call us with numbers   Total encounter time more  than 30 minutes  Greater than 50% was spent in counseling and coordination of care with the patient   No orders of the defined types were placed in this encounter.    Signed, Esmond Plants, M.D., Ph.D. 03/10/2022  [place], [place]

## 2022-03-10 ENCOUNTER — Ambulatory Visit: Payer: Medicare PPO | Admitting: Cardiovascular Disease

## 2022-03-10 ENCOUNTER — Encounter: Payer: Self-pay | Admitting: Cardiovascular Disease

## 2022-03-10 VITALS — BP 162/78 | HR 48 | Ht 69.0 in | Wt 174.1 lb

## 2022-03-10 DIAGNOSIS — I25118 Atherosclerotic heart disease of native coronary artery with other forms of angina pectoris: Secondary | ICD-10-CM

## 2022-03-10 DIAGNOSIS — I10 Essential (primary) hypertension: Secondary | ICD-10-CM | POA: Diagnosis not present

## 2022-03-10 DIAGNOSIS — E782 Mixed hyperlipidemia: Secondary | ICD-10-CM

## 2022-03-10 DIAGNOSIS — R001 Bradycardia, unspecified: Secondary | ICD-10-CM

## 2022-03-10 DIAGNOSIS — R0609 Other forms of dyspnea: Secondary | ICD-10-CM | POA: Diagnosis not present

## 2022-03-10 DIAGNOSIS — R0602 Shortness of breath: Secondary | ICD-10-CM

## 2022-03-10 MED ORDER — EZETIMIBE 10 MG PO TABS: 10.0000 mg | ORAL_TABLET | Freq: Every day | ORAL | 3 refills | Status: DC

## 2022-03-10 NOTE — Patient Instructions (Addendum)
Monitor blood pressure 2-3 hours after morning medication, Call with numbers   Medication Instructions:  Please stop the plavix Stay on aspirin  Please start zetia 10 mg daily  If you need a refill on your cardiac medications before your next appointment, please call your pharmacy.   Lab work: No new labs needed  Testing/Procedures: No new testing needed  Follow-Up: At [facility], you and your health needs are our priority.  As part of our continuing mission to provide you with exceptional heart care, we have created designated Provider Care Teams.  These Care Teams include your primary Cardiologist (physician) and Advanced Practice Providers (APPs -  Physician Assistants and Nurse Practitioners) who all work together to provide you with the care you need, when you need it.  You will need a follow up appointment in 12 months  Providers on your designated Care Team:   Murray Hodgkins, NP Christell Faith, PA-C Cadence Kathlen Mody, [place]  COVID-19 Vaccine Information can be found at: [email or web address] For questions related to vaccine distribution or appointments, please email [email or web address] or call [phone].

## 2023-01-27 ENCOUNTER — Encounter: Payer: Self-pay | Admitting: Nurse Practitioner

## 2023-01-27 ENCOUNTER — Ambulatory Visit: Payer: Medicare PPO | Attending: Nurse Practitioner | Admitting: Nurse Practitioner

## 2023-01-27 VITALS — BP 128/60 | HR 57 | Ht 69.0 in | Wt 153.4 lb

## 2023-01-27 DIAGNOSIS — I10 Essential (primary) hypertension: Secondary | ICD-10-CM

## 2023-01-27 DIAGNOSIS — E782 Mixed hyperlipidemia: Secondary | ICD-10-CM | POA: Diagnosis not present

## 2023-01-27 DIAGNOSIS — I25118 Atherosclerotic heart disease of native coronary artery with other forms of angina pectoris: Secondary | ICD-10-CM | POA: Diagnosis not present

## 2023-01-27 DIAGNOSIS — R0609 Other forms of dyspnea: Secondary | ICD-10-CM | POA: Diagnosis not present

## 2023-01-27 DIAGNOSIS — I6523 Occlusion and stenosis of bilateral carotid arteries: Secondary | ICD-10-CM

## 2023-01-27 DIAGNOSIS — R001 Bradycardia, unspecified: Secondary | ICD-10-CM

## 2023-01-27 MED ORDER — ATORVASTATIN CALCIUM 40 MG PO TABS: 40.0000 mg | ORAL_TABLET | Freq: Every day | ORAL | 1 refills | Status: DC

## 2023-01-27 NOTE — Progress Notes (Signed)
Office Visit    Patient Name: Gerald YUREK Sr. Date of Encounter: 01/27/2023  Primary Care Provider:  Marguarite Arbour, MD Primary Cardiologist:  [name], MD  Chief Complaint    82 y/o ? w/ a h/o CAD status post non-STEMI and drug-eluting stent placement to the ramus intermedius in October 2019, diastolic dysfunction, sinus bradycardia, carotid arterial disease status post right carotid endarterectomy, COPD, diabetes, hypertension, and hyperlipidemia, who presents for follow-up of CAD and dyspnea.  Past Medical History    Past Medical History:  Diagnosis Date   Bradycardia    a. 01/2022 Zio: Predominantly sinus bradycardia at 55 (40-171).  4 SVT runs (fastest 171, longest 9 beats).  Occasional PACs and rare PVCs.  Triggered event assoc w/ sinus rhythm.  No significant arrhythmias.   CAD (coronary artery disease)    a. 06/2018 NSTEMI/PCI: LM w/o sig dz, mLAD 60-70%, LCx w/o sig dz, RCA w/o sig dz, ost ramus 100% (DES), EF 55%.   Cancer [facility])    Carotid artery disease (HCC)    a. s/p R CEA   COPD (chronic obstructive pulmonary disease) (HCC)    Diabetes mellitus with complication (HCC)    Diastolic dysfunction    a. 07/2018 Echo: EF 55-60%, no rwma, GrI DD; b. 01/2022 Echo: EF 60-65%, no rwma, nl RV fxn, mild-mod dil LA, triv AI.   Essential hypertension    Fibromyalgia    HLD (hyperlipidemia)    Polyneuropathy    Tubular adenoma of colon    Past Surgical History:  Procedure Laterality Date   CAROTID ENDARTERECTOMY Right    CATARACT EXTRACTION W/PHACO Left 02/02/2018   Procedure: CATARACT EXTRACTION PHACO AND INTRAOCULAR LENS PLACEMENT (IOC) LEFT DIABETIC;  Surgeon: Lockie Mola, MD;  Location: [facility];  Service: Ophthalmology;  Laterality: Left;  DIABETIC   COLONOSCOPY N/A 07/21/2016   Procedure: COLONOSCOPY;  Surgeon: Christena Deem, MD;  Location: [facility] END[facility]COPY;  Service: Endoscopy;  Laterality: N/A;  CBC; PT/ INR   CORONARY STENT  INTERVENTION N/A 07/08/2018   Procedure: CORONARY STENT INTERVENTION;  Surgeon: Iran Ouch, MD;  Location: [facility] INVASIVE CV LAB;  Service: Cardiovascular;  Laterality: N/A;   EYE SURGERY     LEFT HEART CATH AND CORONARY ANGIOGRAPHY Right 07/08/2018   Procedure: LEFT HEART CATH AND CORONARY ANGIOGRAPHY possible PCI;  Surgeon: Antonieta Iba, MD;  Location: [facility] INVASIVE CV LAB;  Service: Cardiovascular;  Laterality: Right;   SKIN CANCER EXCISION      Allergies  Allergies  Allergen Reactions   Cephalexin Rash   Penicillins Rash    Has patient had a PCN reaction causing immediate rash, facial/tongue/throat swelling, SOB or lightheadedness with hypotension: Unknown Has patient had a PCN reaction causing severe rash involving mucus membranes or skin necrosis: Unknown Has patient had a PCN reaction that required hospitalization: Unknown Has patient had a PCN reaction occurring within the last 10 years: Unknown If all of the above answers are "NO", then may proceed with Cephalosporin use.     History of Present Illness    82 year old male with above past medical history including CAD, diastolic dysfunction, sinus bradycardia, carotid arterial disease status post right carotid endarterectomy, COPD, diabetes, hypertension, and hyperlipidemia.  In October 2019, he presented to [facility] and ruled in for non-STEMI.  Diagnostic catheterization revealed severe disease in the ramus intermedius and this was successfully treated with drug-eluting stent placement.  EF was 55 to 60% with grade 1 diastolic dysfunction.  He  subsequently required discontinuation of beta-blocker therapy in the setting of sinus bradycardia.  In May 2023, he reported fatigue and dyspnea.  Echocardiogram was performed and showed an EF of 60 to 65% with normal RV function and trivial AI.  Event monitoring showed predominantly sinus bradycardia at 59 bpm with 4 brief runs of SVT and occasional PACs, and rare PVCs.   Solitary triggered event was associated with sinus rhythm.  At follow-up visit in June 2023, he reported chronic, stable dyspnea but otherwise was feeling well.  Unfortunately, over the past several months, he has noted progressive dyspnea on exertion, noting that he can only walk about 10 yards prior to having to stop and rest.  Compounding thinks his bilateral knee pain and arthritis.  He does not experience chest pain, and denies palpitations, PND, orthopnea, dizziness, syncope, or edema.  His appetite has fallen off some, and he has lost 22 pounds in the past year.  Home Medications    Current Outpatient Medications  Medication Sig Dispense Refill   acetaminophen (TYLENOL) 500 MG tablet Take 1,000 mg by mouth every 6 (six) hours as needed for mild pain.      amLODipine (NORVASC) 5 MG tablet Take 1 tablet by mouth daily.     atorvastatin (LIPITOR) 80 MG tablet Take 1 tablet (80 mg total) by mouth daily. 30 tablet 1   ezetimibe (ZETIA) 10 MG tablet Take 1 tablet (10 mg total) by mouth daily. 90 tablet 3   glimepiride (AMARYL) 2 MG tablet Takes 2 tablets am and 1 tablet pm.     losartan (COZAAR) 100 MG tablet Take 1 tablet (100 mg total) by mouth daily. 90 tablet 1   ticagrelor (BRILINTA) 90 MG TABS tablet Take 1 tablet by mouth 2 (two) times daily.     triamcinolone ointment (KENALOG) 0.5 % Apply 1 application. topically in the morning and at bedtime.     aspirin 81 MG tablet Take 81 mg by mouth daily. (Patient not taking: Reported on 01/27/2023)     No current facility-administered medications for this visit.     Review of Systems    Progressive dyspnea on exertion and weight loss as outlined above.  She denies chest pain, palpitations, PND, orthopnea, dizziness, syncope, or edema.  All other systems reviewed and are otherwise negative except as noted above.    Physical Exam    VS:  BP 128/60 (BP Location: Left Arm, Patient Position: Sitting, Cuff Size: Normal)   Pulse (!) 57   Ht 5\' 9"   (1.753 m)   Wt 153 lb 6 oz (69.6 kg)   SpO2 94%   BMI 22.65 kg/m  , BMI Body mass index is 22.65 kg/m.     GEN: Thin, in no acute distress. HEENT: normal. Neck: Supple, no JVD, carotid bruits, or masses. Cardiac: RRR, no murmurs, rubs, or gallops. No clubbing, cyanosis, edema.  Radials 2+/PT 2+ and equal bilaterally.  Respiratory:  Respirations regular and unlabored, clear to auscultation bilaterally. GI: Soft, nontender, nondistended, BS + x 4. MS: no deformity or atrophy. Skin: warm and dry, no rash. Neuro:  Strength and sensation are intact. Psych: Normal affect.  Accessory Clinical Findings    ECG personally reviewed by me today -sinus bradycardia, 57, PVC, nonspecific T changes- no acute changes.  Labs dated Jan 22, 2023 from Care Everywhere:  Hemoglobin 2.6, hematocrit 37.6, WBC 8.3, platelets 243 Sodium 142, potassium 3.9, chloride 106, CO2 24.7, BUN 35, creatinine 1.7, glucose 94 Calcium 10.0, albumin 4.2, total  protein 7.4 Total bilirubin 0.8, alkaline phosphatase 70, AST 48, ALT 70 PSA 1.52 TSH 2.848 Hemoglobin A1c 9.1 Total cholesterol 112, triglycerides 182, HDL 39.8, LDL 36  Assessment & Plan    1.  Dyspnea on exertion/coronary artery disease: Status post non-STEMI with stenting of the ramus intermedius in 2019.  Patient with a history of dyspnea on exertion that has become more profound over the past year.  Echocardiogram last year showed normal LV and RV function without any significant valvular disease.  With progression of symptoms, will assess for ischemia with Lexiscan Myoview as dyspnea may be an anginal equivalent.  Continue statin, Zetia, ARB, and calcium channel blocker.  No beta-blocker in the setting of bradycardia.  On review of his medications today, he is currently taking Brilinta 90 mg twice daily and is no longer taking aspirin.  At his last clinic visit with Dr. Mariah Milling, we had that he was taking aspirin and clopidogrel and he was advised to stop  clopidogrel at that time however, patient is not sure that he was ever taking clopidogrel and review of his primary care notes prior to and since his last visit showed that he was actually taking Brilinta and not Plavix.  As we are evaluating him for ischemia, I will not change his antiplatelet therapy at this time however, if his ischemic evaluation is unremarkable, given time since drug-eluting stent placement, will plan to transition him off of Brilinta and onto aspirin.  2.  Essential hypertension: Blood pressure stable on ARB and calcium channel blocker therapy.  3.  Hyperlipidemia: LDL of 36 on atorvastatin 80 and Zetia 10.  He had recent mild elevation of his LFTs and I recommended that he reduce his atorvastatin to 40 mg daily.  Will plan to follow-up LFTs in approximately 6 to 8 weeks.  4.  Sinus bradycardia: Stable.  Asymptomatic.  Avoiding AV nodal blocking agents.  5.  Carotid arterial disease: Status post prior right carotid endarterectomy.  Remains on antiplatelet therapy-currently with Brilinta (see discussion above).  LDL at goal and reducing atorvastatin as above.  Continue Zetia.  He is followed by vascular surgery at [facility].  6.  Disposition: Follow-up Lexiscan Myoview.  Follow-up in clinic in 1 month or sooner if necessary.  Plan to follow-up lipids and LFTs on reduced dose to atorvastatin in the future.  Nicolasa Ducking, NP 01/27/2023, 10:15 AM

## 2023-01-27 NOTE — Patient Instructions (Signed)
Medication Instructions:  DECREASE the Atorvastatin to 40 mg once daily (you may split the 80 mg tablet in half)  *If you need a refill on your cardiac medications before your next appointment, please call your pharmacy*   Lab Work: None ordered If you have labs (blood work) drawn today and your tests are completely normal, you will receive your results only by: MyChart Message (if you have MyChart) OR A paper copy in the mail If you have any lab test that is abnormal or we need to change your treatment, we will call you to review the results.   Testing/Procedures: Your provider has ordered a Lexiscan Myoview Stress test. This will take place at [facility]. Please report to the [facility] medical mall entrance. The volunteers at the first desk will direct you where to go.  [facility] MYOVIEW  Your provider has ordered a Stress Test with nuclear imaging. The purpose of this test is to evaluate the blood supply to your heart muscle. This procedure is referred to as a "Non-Invasive Stress Test." This is because other than having an IV started in your vein, nothing is inserted or "invades" your body. Cardiac stress tests are done to find areas of poor blood flow to the heart by determining the extent of coronary artery disease (CAD). Some patients exercise on a treadmill, which naturally increases the blood flow to your heart, while others who are unable to walk on a treadmill due to physical limitations will have a pharmacologic/chemical stress agent called Lexiscan . This medicine will mimic walking on a treadmill by temporarily increasing your coronary blood flow.   Please note: these test may take anywhere between 2-4 hours to complete  How to prepare for your Myoview test:  Nothing to eat for 6 hours prior to the test No caffeine for 24 hours prior to test No smoking 24 hours prior to test. Your medication may be taken with water.  If your doctor stopped a medication because of this test, do not take that  medication. Ladies, please do not wear dresses.  Skirts or pants are appropriate. Please wear a short sleeve shirt. No perfume, cologne or lotion. Wear comfortable walking shoes. No heels!   PLEASE NOTIFY THE OFFICE AT LEAST 24 HOURS IN ADVANCE IF YOU ARE UNABLE TO KEEP YOUR APPOINTMENT.  [phone] AND  PLEASE NOTIFY NUCLEAR MEDICINE AT [facility] AT LEAST 24 HOURS IN ADVANCE IF YOU ARE UNABLE TO KEEP YOUR APPOINTMENT. [phone]    Follow-Up: At [facility], you and your health needs are our priority.  As part of our continuing mission to provide you with exceptional heart care, we have created designated Provider Care Teams.  These Care Teams include your primary Cardiologist (physician) and Advanced Practice Providers (APPs -  Physician Assistants and Nurse Practitioners) who all work together to provide you with the care you need, when you need it.  We recommend signing up for the patient portal called "MyChart".  Sign up information is provided on this After Visit Summary.  MyChart is used to connect with patients for Virtual Visits (Telemedicine).  Patients are able to view lab/test results, encounter notes, upcoming appointments, etc.  Non-urgent messages can be sent to your provider as well.   To learn more about what you can do with MyChart, go to [email or web address].    Your next appointment:   1 month(s)  Provider:   You may see [name], MD or one of the following Advanced Practice Providers on your designated Care  Team:   Nicolasa Ducking, NP Eula Listen, PA-C Cadence Fransico Michael, PA-C Charlsie Quest, NP

## 2023-02-05 ENCOUNTER — Encounter
Admission: RE | Admit: 2023-02-05 | Discharge: 2023-02-05 | Disposition: A | Discharge status: Home / Self Care | Payer: Medicare PPO | Source: Ambulatory Visit | Attending: Nurse Practitioner | Admitting: Nurse Practitioner

## 2023-02-05 DIAGNOSIS — R0609 Other forms of dyspnea: Secondary | ICD-10-CM

## 2023-02-05 LAB — NM MYOCAR MULTI W/SPECT W/WALL MOTION / EF
LV dias vol: 28 mL (ref 62–150)
LV sys vol: 10 mL
Nuc Stress EF: 64 %
Peak HR: 101 {beats}/min
Percent HR: 73 %
Rest HR: 51 {beats}/min
Rest Nuclear Isotope Dose: 9.8 mCi
SDS: 0
SRS: 2
SSS: 0
Stress Nuclear Isotope Dose: 29 mCi
TID: 0.47

## 2023-02-05 MED ORDER — TECHNETIUM TC 99M TETROFOSMIN IV KIT
29.0200 | PACK | Freq: Once | INTRAVENOUS | Status: AC | PRN
  Administered 2023-02-05: 29.02 via INTRAVENOUS

## 2023-02-05 MED ORDER — REGADENOSON 0.4 MG/5ML IV SOLN
0.4000 mg | Freq: Once | INTRAVENOUS | Status: AC
  Administered 2023-02-05: 0.4 mg via INTRAVENOUS
  Filled 2023-02-05: qty 5

## 2023-02-05 MED ORDER — TECHNETIUM TC 99M TETROFOSMIN IV KIT
9.8400 | PACK | Freq: Once | INTRAVENOUS | Status: AC | PRN
  Administered 2023-02-05: 9.84 via INTRAVENOUS

## 2023-02-09 ENCOUNTER — Other Ambulatory Visit: Payer: Self-pay | Admitting: *Deleted

## 2023-03-09 NOTE — Progress Notes (Unsigned)
Office Visit    Patient Name: Gerald DEGRANDE Sr. Date of Encounter: 03/09/2023  Primary Care Provider:  Marguarite Arbour, MD Primary Cardiologist:  [name], MD  Chief Complaint    82 year old male with past medical history of CAD status post non-STEMI and DES to ramus intermedius in 06/2018, diastolic dysfunction, sinus bradycardia, carotid arterial disease status post right carotid endarterectomy, COPD, diabetes, hypertension and hyperlipidemia. He presents today for follow up regarding CAD and dyspnea.   Past Medical History    Past Medical History:  Diagnosis Date   Bradycardia    a. 01/2022 [facility]io: Predominantly sinus bradycardia at 55 (40-171).  4 SVT runs (fastest 171, longest 9 beats).  Occasional PACs and rare PVCs.  Triggered event assoc w/ sinus rhythm.  No significant arrhythmias.   CAD (coronary artery disease)    a. 06/2018 NSTEMI/PCI: LM w/o sig dz, mLAD 60-70%, LCx w/o sig dz, RCA w/o sig dz, ost ramus 100% (DES), EF 55%.   Cancer [facility])    Carotid artery disease (HCC)    a. s/p R CEA   COPD (chronic obstructive pulmonary disease) (HCC)    Diabetes mellitus with complication (HCC)    Diastolic dysfunction    a. 07/2018 Echo: EF 55-60%, no rwma, GrI DD; b. 01/2022 Echo: EF 60-65%, no rwma, nl RV fxn, mild-mod dil LA, triv AI.   Essential hypertension    Fibromyalgia    HLD (hyperlipidemia)    Polyneuropathy    Tubular adenoma of colon    Past Surgical History:  Procedure Laterality Date   CAROTID ENDARTERECTOMY Right    CATARACT EXTRACTION W/PHACO Left 02/02/2018   Procedure: CATARACT EXTRACTION PHACO AND INTRAOCULAR LENS PLACEMENT (IOC) LEFT DIABETIC;  Surgeon: Lockie Mola, MD;  Location: [facility];  Service: Ophthalmology;  Laterality: Left;  DIABETIC   COLONOSCOPY N/A 07/21/2016   Procedure: COLONOSCOPY;  Surgeon: Christena Deem, MD;  Location: [facility] ENDOSCOPY;  Service: Endoscopy;  Laterality: N/A;  CBC; PT/ INR   CORONARY STENT  INTERVENTION N/A 07/08/2018   Procedure: CORONARY STENT INTERVENTION;  Surgeon: Iran Ouch, MD;  Location: [facility] INVASIVE CV LAB;  Service: Cardiovascular;  Laterality: N/A;   EYE SURGERY     LEFT HEART CATH AND CORONARY ANGIOGRAPHY Right 07/08/2018   Procedure: LEFT HEART CATH AND CORONARY ANGIOGRAPHY possible PCI;  Surgeon: Antonieta Iba, MD;  Location: [facility] INVASIVE CV LAB;  Service: Cardiovascular;  Laterality: Right;   SKIN CANCER EXCISION     Allergies Allergies  Allergen Reactions   Cephalexin Rash   Penicillins Rash    Has patient had a PCN reaction causing immediate rash, facial/tongue/throat swelling, SOB or lightheadedness with hypotension: Unknown Has patient had a PCN reaction causing severe rash involving mucus membranes or skin necrosis: Unknown Has patient had a PCN reaction that required hospitalization: Unknown Has patient had a PCN reaction occurring within the last 10 years: Unknown If all of the above answers are "NO", then may proceed with Cephalosporin use.    Labs/Other Studies Reviewed    The following studies were reviewed today: Cardiac Studies & Procedures   CARDIAC CATHETERIZATION  CARDIAC CATHETERIZATION 07/08/2018  Narrative  Mid LAD lesion is 60% stenosed.  Ost Ramus to Ramus lesion is 100% stenosed.  A drug-eluting stent was successfully placed using a STENT RESOLUTE ONYX 2.0X22.  Post intervention, there is a 0% residual stenosis.  Successful angioplasty and drug-eluting stent placement to the ramus intermedius.  This was complicated by no  reflow due to distal embolization.  This was treated successfully with intracoronary adenosine.  Recommendations:  Recommend uninterrupted dual antiplatelet therapy with Aspirin 81mg  daily and Ticagrelor 90mg  twice daily for a minimum of 12 months (ACS - Class I recommendation). I increase atorvastatin to 80 mg daily. Further FFR based revascularization of the LAD can be considered as an  outpatient based on symptoms.  Findings Coronary Findings Diagnostic  Dominance: Right  Left Anterior Descending Mid LAD lesion is 60% stenosed.  First Diagonal Branch  Ramus Intermedius Ost Ramus to Ramus lesion is 100% stenosed.  Intervention  Ost Ramus to Ramus lesion Stent Lesion length:  21 mm. CATH VISTA GUIDE 6FR XB3.5 guide catheter was inserted. Lesion crossed with guidewire using a WIRE RUNTHROUGH .[id number]. Pre-stent angioplasty was performed using a BALLOON MINITREK RX 2.0X20. Maximum pressure:  8 atm. Inflation time:  20 sec. A drug-eluting stent was successfully placed using a STENT RESOLUTE ONYX 2.0X22. Maximum pressure: 12 atm. Inflation time: 20 sec. Post-stent angioplasty was not performed. Post-Intervention Lesion Assessment The intervention was successful. Pre-interventional TIMI flow is 0. Post-intervention TIMI flow is 2. No-reflow occurred during the intervention. IC adenosine and nitroglycerin was given. No reflow was noted in the vessel after stent deployment.  This was suspected to be due to distal embolization given long diffuse lesion and relatively small artery.  This was treated with intracoronary adenosine.  A total of 480 mcg of intracoronary adenosine was given in 4 divided doses.  This improved flow to TIMI II or close to TIMI-3 flow. There is a 0% residual stenosis post intervention.   CARDIAC CATHETERIZATION 07/08/2018  Narrative  Mid LAD lesion is 60% stenosed.  Ost Ramus to Ramus lesion is 100% stenosed.      Findings Coronary Findings Diagnostic  Dominance: Right  Left Anterior Descending Mid LAD lesion is 60% stenosed.  First Diagonal Branch  Ramus Intermedius Ost Ramus to Ramus lesion is 100% stenosed.  Intervention  No interventions have been documented.   STRESS TESTS  NM MYOCAR MULTI W/SPECT W 02/05/2023  Narrative Pharmacological myocardial perfusion imaging study with no significant  ischemia Normal wall motion,  EF estimated at 54% No EKG changes concerning for ischemia at peak stress or in recovery. CT attenuation correction images with minimal aortic atherosclerosis,  coronary calcification and stent in the proximal ramus branch Low risk scan   Signed, Dossie Arbour, MD, Ph.D [facility]   ECHOCARDIOGRAM  ECHOCARDIOGRAM COMPLETE 02/05/2022  Narrative ECHOCARDIOGRAM REPORT    Patient Name:   Sr. SHERVIN LAMERS Sr. Date of Exam: 02/05/2022 Medical Rec #:  [id number]                 Height:       69.0 in Accession #:    [id number]                Weight:       175.0 lb Date of Birth:  19-Feb-1941                 BSA:          1.952 m Patient Age:    81 years                  BP:           140/72 mmHg Patient Gender: M                         HR:  59 bpm. Exam Location:  [facility]  Procedure: 2D Echo, Cardiac Doppler and Color Doppler  Indications:    R06.9 DOE  History:        Patient has prior history of Echocardiogram examinations, most recent 07/22/2018. Previous Myocardial Infarction, COPD, Signs/Symptoms:Dyspnea; Risk Factors:Former Smoker and Hypertension.  Sonographer:    Dondra Prader RVT Referring Phys: [id number] CADENCE H FURTH  IMPRESSIONS   1. Left ventricular ejection fraction, by estimation, is 60 to 65%. The left ventricle has normal function. The left ventricle has no regional wall motion abnormalities. Left ventricular diastolic parameters are indeterminate. 2. Right ventricular systolic function is normal. The right ventricular size is normal. 3. Left atrial size was mild to moderately dilated. 4. The mitral valve is normal in structure. No evidence of mitral valve regurgitation. 5. The aortic valve is tricuspid. Aortic valve regurgitation is trivial. 6. The inferior vena cava is dilated in size with <50% respiratory variability, suggesting right atrial pressure of 15 mmHg.  Comparison(s): Echo 07/2018 with LVEF 55-60%, diatolic dysfunction, mild left  atrial enlargement.  FINDINGS Left Ventricle: Left ventricular ejection fraction, by estimation, is 60 to 65%. The left ventricle has normal function. The left ventricle has no regional wall motion abnormalities. The left ventricular internal cavity size was normal in size. There is no left ventricular hypertrophy. Left ventricular diastolic parameters are indeterminate.  Right Ventricle: The right ventricular size is normal. No increase in right ventricular wall thickness. Right ventricular systolic function is normal.  Left Atrium: Left atrial size was mild to moderately dilated.  Right Atrium: Right atrial size was normal in size.  Pericardium: There is no evidence of pericardial effusion.  Mitral Valve: The mitral valve is normal in structure. No evidence of mitral valve regurgitation.  Tricuspid Valve: The tricuspid valve is normal in structure. Tricuspid valve regurgitation is not demonstrated.  Aortic Valve: The aortic valve is tricuspid. Aortic valve regurgitation is trivial. Aortic valve mean gradient measures 3.0 mmHg. Aortic valve peak gradient measures 4.8 mmHg. Aortic valve area, by VTI measures 2.96 cm.  Pulmonic Valve: The pulmonic valve was not well visualized. Pulmonic valve regurgitation is not visualized.  Aorta: The aortic root and ascending aorta are structurally normal, with no evidence of dilitation.  Venous: The inferior vena cava is dilated in size with less than 50% respiratory variability, suggesting right atrial pressure of 15 mmHg.  IAS/Shunts: No atrial level shunt detected by color flow Doppler.   LEFT VENTRICLE PLAX 2D LVIDd:         4.90 cm   Diastology LVIDs:         3.10 cm   LV e' medial:    5.44 cm/s LV PW:         1.10 cm   LV E/e' medial:  10.8 LV IVS:        1.10 cm   LV e' lateral:   5.98 cm/s LVOT diam:     2.00 cm   LV E/e' lateral: 9.8 LV SV:         75 LV SV Index:   38 LVOT Area:     3.14 cm  3D Volume EF: 3D EF:        67 % LV  EDV:       111 ml LV ESV:       37 ml LV SV:        74 ml  RIGHT VENTRICLE             IVC  RV S prime:     14.40 cm/s  IVC diam: 2.10 cm TAPSE (M-mode): 1.8 cm  LEFT ATRIUM           Index        RIGHT ATRIUM           Index LA diam:      4.60 cm 2.36 cm/m   RA Area:     17.40 cm LA Vol (A4C): 83.0 ml 42.52 ml/m  RA Volume:   44.70 ml  22.90 ml/m AORTIC VALVE                    PULMONIC VALVE AV Area (Vmax):    2.71 cm     PV Vmax:          0.81 m/s AV Area (Vmean):   2.46 cm     PV Peak grad:     2.6 mmHg AV Area (VTI):     2.96 cm     PR End Diast Vel: 2.09 msec AV Vmax:           110.00 cm/s AV Vmean:          81.200 cm/s AV VTI:            0.254 m AV Peak Grad:      4.8 mmHg AV Mean Grad:      3.0 mmHg LVOT Vmax:         94.80 cm/s LVOT Vmean:        63.500 cm/s LVOT VTI:          0.239 m LVOT/AV VTI ratio: 0.94 AR Vena Contracta: 0.30 cm  AORTA Ao Root diam: 3.40 cm Ao Asc diam:  3.40 cm  MITRAL VALVE MV Area (PHT): 4.12 cm    SHUNTS MV Decel Time: 184 msec    Systemic VTI:  0.24 m MV E velocity: 58.60 cm/s  Systemic Diam: 2.00 cm MV A velocity: 49.40 cm/s MV E/A ratio:  1.19  Debbe Odea MD Electronically signed by Debbe Odea MD Signature Date/Time: 02/05/2022/1:21:53 PM    Final    MONITORS  LONG TERM MONITOR (3-14 DAYS) 02/12/2022  Narrative Event monitor Patch Wear Time:  13 days and 6 hours (2023-05-04T07:55:41-0400 to 2023-05-17T14:37:25-0400)  Normal sinus rhythm Patient had a min HR of 40 bpm, max HR of 171 bpm, and avg HR of 59 bpm.  4 Supraventricular Tachycardia runs occurred, the run with the fastest interval lasting 8 beats with a max rate of 171 bpm, the longest lasting 9 beats with an avg rate of 101 bpm.  Isolated SVEs were occasional (1.4%, 15491), SVE Couplets were rare (<1.0%, 53), and no SVE Triplets were present.  Isolated VEs were rare (<1.0%), VE Couplets were rare (<1.0%), and no VE Triplets were present.  Ventricular Trigeminy was present.  Triggered event (1)  associated with normal sinus rhythm, no significant arrhythmia  Signed, Dossie Arbour, MD, Ph.D [facility]          Recent Labs from Care Everywhere: 03/05/23: Sodium, 142, Potassium 4.3, BUN 34, creatinine 1.2, eGFR 60 01/22/23:  Hemoglobin 12.6, hematocrit 37.6 Hemoglobin A1C 9.1, Cholesterol 112, Triglyceride 182, HDL 39.8, LDL 36 Total bilirubin 0.8, alkaline phosphate 70, AST 48, ALT 70 TSH 2.848  History of Present Illness    82 year old male with past medical history of CAD, diastolic dysfunction, sinus bradycardia, carotid arterial disease status post right carotid endarterectomy, COPD, diabetes, hypertension and hyperlipidemia. In October 2019 he presented  with non-STEMI, he underwent cardiac catheterization with placement of DES to the ramus intermedius. At that time his EF was 55 to 60% with grade I diastolic dysfunction. Following he required discontinuation of beta blocker in setting of bradycardia.     He reported increased fatigue and dyspnea in 01/2022. His echocardiogram indicated an EF of 60 to 65% with normal RV function and trivial AI. His event monitor showed predominantly sinus bradycardia at 59bpm with 4 brief runs of SVT and occasional PACS and rare PVCs. There was a single triggered event associated with sinus rhythm. On follow up in June 2023 he reported chronic, stable dyspnea but was otherwise well.     His last office visit was on 01/27/23 at which he was reporting several months of progressive dyspnea on exertion, only being able to walk 10 yards before having to stop and rest. He felt that his arthritis and bilateral knee pain was compounding factors. Further his appetite had decreased and he had lost 22 pounds in the past year. A lexiscan myoview was ordered to determine if dyspnea was an anginal equivalent. On 02/05/23 his myoview indicated low risk, indicated no significant ischemia, normal wall motion and EF  estimated at 54%. CT images showed minimal aortic atherosclerosis, coronary calcification and stent in the proximal ramus branch.   Today he reports he is doing well overall. He feels his dyspnea on exertion is related to pain in his knees and legs when walking. He was started on Celebrex by his PCP last week and has noticed some improvement in his pain and walking ability. He denies chest pain, palpitations, pnd, orthopnea, dizziness, syncope, edema, weight gain, or early satiety. Confirms he stopped taking his Brilinta following his myoview results and was taking Aspirin 81mg  until he stopped last week, notes he was unsure if he was supposed to be taking.   Home Medications    Current Outpatient Medications  Medication Sig Dispense Refill   acetaminophen (TYLENOL) 500 MG tablet Take 1,000 mg by mouth every 6 (six) hours as needed for mild pain.      amLODipine (NORVASC) 5 MG tablet Take 1 tablet by mouth daily.     aspirin 81 MG tablet Take 81 mg by mouth daily. (Patient not taking: Reported on 01/27/2023)     atorvastatin (LIPITOR) 40 MG tablet Take 1 tablet (40 mg total) by mouth daily. 30 tablet 1   ezetimibe (ZETIA) 10 MG tablet Take 1 tablet (10 mg total) by mouth daily. 90 tablet 3   glimepiride (AMARYL) 2 MG tablet Takes 2 tablets am and 1 tablet pm.     losartan (COZAAR) 100 MG tablet Take 1 tablet (100 mg total) by mouth daily. 90 tablet 1   triamcinolone ointment (KENALOG) 0.5 % Apply 1 application. topically in the morning and at bedtime.     No current facility-administered medications for this visit.     Review of Systems    He denies chest pain, palpitations, pnd, orthopnea, n, v, dizziness, syncope, edema, weight gain, or early satiety. All other systems reviewed and are otherwise negative except as noted above.    Physical Exam    VS:  BP (!) 114/58   Pulse (!) 55   Ht 5\' 9"  (1.753 m)   Wt 155 lb 2 oz (70.4 kg)   SpO2 98%   BMI 22.91 kg/m  , BMI Body mass index is  22.91 kg/m.     GEN: Thin, in no acute distress. HEENT: normal. Neck: Supple,  no JVD, carotid bruits, or masses. Cardiac: RRR, no murmurs, rubs, or gallops. No clubbing, cyanosis, edema.  Radials/DP/PT 2+ and equal bilaterally.  Respiratory:  Respirations regular and unlabored, clear to auscultation bilaterally. GI: Soft, nontender, nondistended, BS + x 4. MS: no deformity or atrophy. Skin: warm and dry, no rash. Neuro:  Strength and sensation are intact. Psych: Normal affect.  Accessory Clinical Findings    No EKG ordered today.    Assessment & Plan    Dyspnea on exertion/coronary artery disease: October 2019 he presented with non-STEMI, underwent cardiac catheterization with placement of DES to the ramus intermedius. His lexiscan Myoview 02/05/23 indicated low risk, with no significant ischemia, normal wall motion and EF at 54%. Given the numerous years since non-STEMI and no significant ischemia noted on myoview stopped Brilinta and transitioned to Aspirin 81mg  daily. Today he denies anginal symptoms and feels his DOE has slightly improved since starting Celebrex for lower extremity pain. He reported some confusion regarding if should be taking aspirin, discussed and confirmed patient understanding that he should resume taking aspirin 81mg  daily. Continue amlodipine 5mg  daily, atorvastatin 40mg  daily, ezetimibe 10mg  daily, losartan 100mg  daily.   Essential hypertension: Initial blood pressure today 100/40, on recheck was 114/58. He denies symptoms, notes he is feeling well. He has not been monitoring his BP at home. He will monitor home blood pressure and notify office if consistently low or if he becomes symptomatic. Continue amlodipine 5mg  daily and losartan 100mg  daily.   Hyperlipidemia: Last lipid panel 01/22/23 cholesterol 112, Triglyceride 182, HDL 39.8, LDL 36. His LFTs on 01/22/23 were slightly elevated, AST 48 and ALT 70. Atorvastatin was decreased to 40mg  daily at last office visit  given slightly elevated LFTs and his LDL of 36. Repeat LFTs and fasting lipid panel in four weeks as he recently had Bmet done with PCP.   Sinus bradycardia: Today his heart rate is 54bpm, he is asymptomatic. Avoiding AV nodal blocking agents   Carotid arterial disease: S/p right carotid endarterectomy. Followed by [facility]. LDL at goal on May labs, atorvastatin reduced at last OV. Repeat fasting Lipid panel in four weeks as noted above.   Disposition: Follow up in clinic in 3 months or sooner if necessary. Follow up fasting lipid panel and LFTs in four weeks.      Rip Harbour, NP 03/10/2023, 9:47 AM

## 2023-03-10 ENCOUNTER — Encounter: Payer: Self-pay | Admitting: Nurse Practitioner

## 2023-03-10 ENCOUNTER — Ambulatory Visit: Payer: Medicare PPO | Attending: Nurse Practitioner | Admitting: Cardiology

## 2023-03-10 VITALS — BP 114/58 | HR 55 | Ht 69.0 in | Wt 155.1 lb

## 2023-03-10 DIAGNOSIS — I6523 Occlusion and stenosis of bilateral carotid arteries: Secondary | ICD-10-CM

## 2023-03-10 DIAGNOSIS — R001 Bradycardia, unspecified: Secondary | ICD-10-CM

## 2023-03-10 DIAGNOSIS — E782 Mixed hyperlipidemia: Secondary | ICD-10-CM | POA: Diagnosis not present

## 2023-03-10 DIAGNOSIS — R0602 Shortness of breath: Secondary | ICD-10-CM | POA: Diagnosis not present

## 2023-03-10 DIAGNOSIS — I25118 Atherosclerotic heart disease of native coronary artery with other forms of angina pectoris: Secondary | ICD-10-CM | POA: Diagnosis not present

## 2023-03-10 DIAGNOSIS — I10 Essential (primary) hypertension: Secondary | ICD-10-CM | POA: Insufficient documentation

## 2023-03-10 MED ORDER — ASPIRIN 81 MG PO TBEC: 81.0000 mg | DELAYED_RELEASE_TABLET | Freq: Every day | ORAL | 3 refills | Status: AC

## 2023-03-10 NOTE — Patient Instructions (Signed)
Medication Instructions:   Start Taking: Aspirin 81 mg daily  *If you need a refill on your cardiac medications before your next appointment, please call your pharmacy*   Lab Work: Your provider would like for you to return in 4 weeks to have the following labs drawn: LFT, Lipid panel.   Please go to the [facility] entrance and check in at the front desk.  You do not need an appointment.  They are open from 7am-6 pm.  You need to be fasting.  If you have labs (blood work) drawn today and your tests are completely normal, you will receive your results only by: MyChart Message (if you have MyChart) OR A paper copy in the mail If you have any lab test that is abnormal or we need to change your treatment, we will call you to review the results.   Testing/Procedures: none   Follow-Up: At [facility], you and your health needs are our priority.  As part of our continuing mission to provide you with exceptional heart care, we have created designated Provider Care Teams.  These Care Teams include your primary Cardiologist (physician) and Advanced Practice Providers (APPs -  Physician Assistants and Nurse Practitioners) who all work together to provide you with the care you need, when you need it.  We recommend signing up for the patient portal called "MyChart".  Sign up information is provided on this After Visit Summary.  MyChart is used to connect with patients for Virtual Visits (Telemedicine).  Patients are able to view lab/test results, encounter notes, upcoming appointments, etc.  Non-urgent messages can be sent to your provider as well.   To learn more about what you can do with MyChart, go to [email or web address].    Your next appointment:   3 month(s)  Provider:   [name], MD or Nicolasa Ducking, NP

## 2023-06-15 NOTE — Progress Notes (Unsigned)
Cardiology Office Note    Date:  06/16/2023  ID:  Gerald VIOLET Sr., DOB December 27, 1940, MRN [id number] PCP:  Marguarite Arbour, MD  Cardiologist:  [name], MD  Electrophysiologist:  None   Chief Complaint: Follow up of CAD and hypertension.   History of Present Illness: .    Gerald Specter Sr. is a 82 y.o. male with visit-pertinent history of CAD, diastolic dysfunction, sinus bradycardia, carotid arterial disease status post right carotid endarterectomy, COPD, diabetes, hypertension and hyperlipidemia.   In October 2019 he presented with non-STEMI, he underwent cardiac catheterization with placement of DES to the ramus intermedius. At that time his EF was 55 to 60% with grade I diastolic dysfunction. Following he required discontinuation of beta blocker in setting of bradycardia.     He reported increased fatigue and dyspnea in 01/2022. His echocardiogram indicated an EF of 60 to 65% with normal RV function and trivial AI. His event monitor showed predominantly sinus bradycardia at 59bpm with 4 brief runs of SVT and occasional PACS and rare PVCs. There was a single triggered event associated with sinus rhythm. On follow up in June 2023 he reported chronic, stable dyspnea but was otherwise well.     At office visit on 01/27/23 he was reported several months of progressive dyspnea on exertion, only being able to walk 10 yards before having to stop and rest. He felt that his arthritis and bilateral knee pain was compounding factors. Further his appetite had decreased and he had lost 22 pounds in the past year. A lexiscan myoview was ordered to determine if dyspnea was an anginal equivalent. On 02/05/23 his myoview indicated low risk, indicated no significant ischemia, normal wall motion and EF estimated at 54%. CT images showed minimal aortic atherosclerosis, coronary calcification and stent in the proximal ramus branch.   On follow up on 03/10/23 he reported he was doing well. He felt his  dyspnea on exertion was related to pain in his knees and legs when walking. He was started on Celebrex by his PCP lthe week prior with some improvement.   Today he presents for follow up. He reports he is doing well, he has no cardiac concerns or complaints today. He denies any chest pain or shortness of breath. His main concern is intermittent pain from fibromyalgia, he is following with PCP for this. He has been working in his yard, tolerating well.   Labwork independently reviewed: 05/04/23:Hemoglobin 13.4, hematocrit 40.4, sodium 139, potassium 4.6, creatinine 1.1, AST 22, ALT 24, cholesterol 137, triglyceride 217, HDL 44, LDL 49  ROS: .   Today he denies chest pain, shortness of breath, lower extremity edema, fatigue, palpitations, melena, hematuria, hemoptysis, diaphoresis, weakness, presyncope, syncope, orthopnea, and PND. All other systems reviewed and are otherwise negative except as noted above.   Studies Reviewed: Marland Kitchen    EKG:  EKG not ordered today. Personally reviewed EKG from 01/27/23, demonstrating sinus bradycardia with occasional PVCs at 57 bpm, nonspecific T wave abnormality.   CV Studies: Cardiac studies reviewed are outlined and summarized above. Otherwise please see EMR for full report. Cardiac Studies & Procedures   CARDIAC CATHETERIZATION  CARDIAC CATHETERIZATION 07/08/2018  Narrative  Mid LAD lesion is 60% stenosed.  Ost Ramus to Ramus lesion is 100% stenosed.  A drug-eluting stent was successfully placed using a STENT RESOLUTE ONYX 2.0X22.  Post intervention, there is a 0% residual stenosis.  Successful angioplasty and drug-eluting stent placement to the ramus intermedius.  This was complicated by  no reflow due to distal embolization.  This was treated successfully with intracoronary adenosine.  Recommendations:  Recommend uninterrupted dual antiplatelet therapy with Aspirin 81mg  daily and Ticagrelor 90mg  twice daily for a minimum of 12 months (ACS - Class I  recommendation). I increase atorvastatin to 80 mg daily. Further FFR based revascularization of the LAD can be considered as an outpatient based on symptoms.  Findings Coronary Findings Diagnostic  Dominance: Right  Left Anterior Descending Mid LAD lesion is 60% stenosed.  First Diagonal Branch  Ramus Intermedius Ost Ramus to Ramus lesion is 100% stenosed.  Intervention  Ost Ramus to Ramus lesion Stent Lesion length:  21 mm. CATH VISTA GUIDE 6FR XB3.5 guide catheter was inserted. Lesion crossed with guidewire using a WIRE RUNTHROUGH .[id number]. Pre-stent angioplasty was performed using a BALLOON MINITREK RX 2.0X20. Maximum pressure:  8 atm. Inflation time:  20 sec. A drug-eluting stent was successfully placed using a STENT RESOLUTE ONYX 2.0X22. Maximum pressure: 12 atm. Inflation time: 20 sec. Post-stent angioplasty was not performed. Post-Intervention Lesion Assessment The intervention was successful. Pre-interventional TIMI flow is 0. Post-intervention TIMI flow is 2. No-reflow occurred during the intervention. IC adenosine and nitroglycerin was given. No reflow was noted in the vessel after stent deployment.  This was suspected to be due to distal embolization given long diffuse lesion and relatively small artery.  This was treated with intracoronary adenosine.  A total of 480 mcg of intracoronary adenosine was given in 4 divided doses.  This improved flow to TIMI II or close to TIMI-3 flow. There is a 0% residual stenosis post intervention.   CARDIAC CATHETERIZATION 07/08/2018  Narrative  Mid LAD lesion is 60% stenosed.  Ost Ramus to Ramus lesion is 100% stenosed.      Findings Coronary Findings Diagnostic  Dominance: Right  Left Anterior Descending Mid LAD lesion is 60% stenosed.  First Diagonal Branch  Ramus Intermedius Ost Ramus to Ramus lesion is 100% stenosed.  Intervention  No interventions have been documented.   STRESS TESTS  NM MYOCAR MULTI W/SPECT  W 02/05/2023  Narrative Pharmacological myocardial perfusion imaging study with no significant  ischemia Normal wall motion, EF estimated at 54% No EKG changes concerning for ischemia at peak stress or in recovery. CT attenuation correction images with minimal aortic atherosclerosis,  coronary calcification and stent in the proximal ramus branch Low risk scan   Signed, Dossie Arbour, MD, Ph.D [facility]   ECHOCARDIOGRAM  ECHOCARDIOGRAM COMPLETE 02/05/2022  Narrative ECHOCARDIOGRAM REPORT    Patient Name:   Sr. Gerald HEINSOHN Sr. Date of Exam: 02/05/2022 Medical Rec #:  [id number]                 Height:       69.0 in Accession #:    [id number]                Weight:       175.0 lb Date of Birth:  26-Dec-1940                 BSA:          1.952 m Patient Age:    81 years                  BP:           140/72 mmHg Patient Gender: M                         HR:  59 bpm. Exam Location:  [place]  Procedure: 2D Echo, Cardiac Doppler and Color Doppler  Indications:    R06.9 DOE  History:        Patient has prior history of Echocardiogram examinations, most recent 07/22/2018. Previous Myocardial Infarction, COPD, Signs/Symptoms:Dyspnea; Risk Factors:Former Smoker and Hypertension.  Sonographer:    Dondra Prader RVT Referring Phys: [id number] CADENCE H FURTH  IMPRESSIONS   1. Left ventricular ejection fraction, by estimation, is 60 to 65%. The left ventricle has normal function. The left ventricle has no regional wall motion abnormalities. Left ventricular diastolic parameters are indeterminate. 2. Right ventricular systolic function is normal. The right ventricular size is normal. 3. Left atrial size was mild to moderately dilated. 4. The mitral valve is normal in structure. No evidence of mitral valve regurgitation. 5. The aortic valve is tricuspid. Aortic valve regurgitation is trivial. 6. The inferior vena cava is dilated in size with <50% respiratory variability,  suggesting right atrial pressure of 15 mmHg.  Comparison(s): Echo 07/2018 with LVEF 55-60%, diatolic dysfunction, mild left atrial enlargement.  FINDINGS Left Ventricle: Left ventricular ejection fraction, by estimation, is 60 to 65%. The left ventricle has normal function. The left ventricle has no regional wall motion abnormalities. The left ventricular internal cavity size was normal in size. There is no left ventricular hypertrophy. Left ventricular diastolic parameters are indeterminate.  Right Ventricle: The right ventricular size is normal. No increase in right ventricular wall thickness. Right ventricular systolic function is normal.  Left Atrium: Left atrial size was mild to moderately dilated.  Right Atrium: Right atrial size was normal in size.  Pericardium: There is no evidence of pericardial effusion.  Mitral Valve: The mitral valve is normal in structure. No evidence of mitral valve regurgitation.  Tricuspid Valve: The tricuspid valve is normal in structure. Tricuspid valve regurgitation is not demonstrated.  Aortic Valve: The aortic valve is tricuspid. Aortic valve regurgitation is trivial. Aortic valve mean gradient measures 3.0 mmHg. Aortic valve peak gradient measures 4.8 mmHg. Aortic valve area, by VTI measures 2.96 cm.  Pulmonic Valve: The pulmonic valve was not well visualized. Pulmonic valve regurgitation is not visualized.  Aorta: The aortic root and ascending aorta are structurally normal, with no evidence of dilitation.  Venous: The inferior vena cava is dilated in size with less than 50% respiratory variability, suggesting right atrial pressure of 15 mmHg.  IAS/Shunts: No atrial level shunt detected by color flow Doppler.   LEFT VENTRICLE PLAX 2D LVIDd:         4.90 cm   Diastology LVIDs:         3.10 cm   LV e' medial:    5.44 cm/s LV PW:         1.10 cm   LV E/e' medial:  10.8 LV IVS:        1.10 cm   LV e' lateral:   5.98 cm/s LVOT diam:     2.00 cm    LV E/e' lateral: 9.8 LV SV:         75 LV SV Index:   38 LVOT Area:     3.14 cm  3D Volume EF: 3D EF:        67 % LV EDV:       111 ml LV ESV:       37 ml LV SV:        74 ml  RIGHT VENTRICLE             IVC  RV S prime:     14.40 cm/s  IVC diam: 2.10 cm TAPSE (M-mode): 1.8 cm  LEFT ATRIUM           Index        RIGHT ATRIUM           Index LA diam:      4.60 cm 2.36 cm/m   RA Area:     17.40 cm LA Vol (A4C): 83.0 ml 42.52 ml/m  RA Volume:   44.70 ml  22.90 ml/m AORTIC VALVE                    PULMONIC VALVE AV Area (Vmax):    2.71 cm     PV Vmax:          0.81 m/s AV Area (Vmean):   2.46 cm     PV Peak grad:     2.6 mmHg AV Area (VTI):     2.96 cm     PR End Diast Vel: 2.09 msec AV Vmax:           110.00 cm/s AV Vmean:          81.200 cm/s AV VTI:            0.254 m AV Peak Grad:      4.8 mmHg AV Mean Grad:      3.0 mmHg LVOT Vmax:         94.80 cm/s LVOT Vmean:        63.500 cm/s LVOT VTI:          0.239 m LVOT/AV VTI ratio: 0.94 AR Vena Contracta: 0.30 cm  AORTA Ao Root diam: 3.40 cm Ao Asc diam:  3.40 cm  MITRAL VALVE MV Area (PHT): 4.12 cm    SHUNTS MV Decel Time: 184 msec    Systemic VTI:  0.24 m MV E velocity: 58.60 cm/s  Systemic Diam: 2.00 cm MV A velocity: 49.40 cm/s MV E/A ratio:  1.19  Debbe Odea MD Electronically signed by Debbe Odea MD Signature Date/Time: 02/05/2022/1:21:53 PM    Final    MONITORS  LONG TERM MONITOR (3-14 DAYS) 02/12/2022  Narrative Event monitor Patch Wear Time:  13 days and 6 hours (2023-05-04T07:55:41-0400 to 2023-05-17T14:37:25-0400)  Normal sinus rhythm Patient had a min HR of 40 bpm, max HR of 171 bpm, and avg HR of 59 bpm.  4 Supraventricular Tachycardia runs occurred, the run with the fastest interval lasting 8 beats with a max rate of 171 bpm, the longest lasting 9 beats with an avg rate of 101 bpm.  Isolated SVEs were occasional (1.4%, 15491), SVE Couplets were rare (<1.0%, 53), and no SVE  Triplets were present.  Isolated VEs were rare (<1.0%), VE Couplets were rare (<1.0%), and no VE Triplets were present. Ventricular Trigeminy was present.  Triggered event (1)  associated with normal sinus rhythm, no significant arrhythmia  Signed, Dossie Arbour, MD, Ph.D [facility]             Current Reported Medications:.    Current Meds  Medication Sig   acetaminophen (TYLENOL) 500 MG tablet Take 1,000 mg by mouth every 6 (six) hours as needed for mild pain.    amLODipine (NORVASC) 5 MG tablet Take 1 tablet by mouth daily.   aspirin EC 81 MG tablet Take 1 tablet (81 mg total) by mouth daily. Swallow whole.   atorvastatin (LIPITOR) 40 MG tablet Take 1 tablet (40 mg total) by mouth daily.  ezetimibe (ZETIA) 10 MG tablet Take 1 tablet (10 mg total) by mouth daily.   glimepiride (AMARYL) 2 MG tablet Takes 2 tablets am and 1 tablet pm.   losartan (COZAAR) 100 MG tablet Take 1 tablet (100 mg total) by mouth daily.   triamcinolone ointment (KENALOG) 0.5 % Apply 1 application. topically in the morning and at bedtime.    Physical Exam:    VS:  BP 110/60 (BP Location: Left Arm, Patient Position: Sitting, Cuff Size: Normal)   Pulse (!) 56   Ht 5\' 9"  (1.753 m)   Wt 150 lb 4 oz (68.2 kg)   SpO2 98%   BMI 22.19 kg/m    Wt Readings from Last 3 Encounters:  06/16/23 150 lb 4 oz (68.2 kg)  03/10/23 155 lb 2 oz (70.4 kg)  01/27/23 153 lb 6 oz (69.6 kg)    GEN: Well nourished, well developed in no acute distress NECK: No JVD; No carotid bruits CARDIAC: RRR, no murmurs, rubs, gallops RESPIRATORY:  Clear to auscultation without rales, wheezing or rhonchi  ABDOMEN: Soft, non-tender, non-distended EXTREMITIES:  No edema; No acute deformity     Asessement and Plan:Marland Kitchen    Dyspnea on exertion/coronary artery disease: October 2019 he presented with non-STEMI, underwent cardiac catheterization with placement of DES to the ramus intermedius. His lexiscan Myoview 02/05/23 indicated low risk,  with no significant ischemia, normal wall motion and EF at 54%. Today he denies anginal symptoms and feels his DOE has overall resolved. Heart healthy diet and regular cardiovascular exercise encouraged. Continue aspirin 81 mg daily, amlodipine 5mg  daily, atorvastatin 40mg  daily, ezetimibe 10mg  daily, losartan 100mg  daily.    Essential hypertension: Blood pressure well controlled today at 110/60. Continue amlodipine 5mg  daily and losartan 100mg  daily.    Hyperlipidemia: His LFTs on 01/22/23 were slightly elevated, AST 48 and ALT 70. Atorvastatin was decreased to 40mg  daily at last office visit given slightly elevated LFTs and his LDL of 36.  Repeat fasting lipid profile on 05/04/2023 indicated total cholesterol of 137, triglycerides 217, HDL 44.3 and LDL 49, LFTs had returned to normal with AST at 22 and ALT at 24.  Continue atorvastatin 40 mg daily.   Sinus bradycardia: Today his heart rate is 56 bpm, he is asymptomatic. He denies episodes of presyncope or syncope. Avoiding AV nodal blocking agents    Carotid arterial disease: S/p right carotid endarterectomy. Followed by [facility].    Disposition: F/u with Dr. Mariah Milling or Ward Givens, NP in 6 months.   Signed, Rip Harbour, NP

## 2023-06-16 ENCOUNTER — Ambulatory Visit: Payer: Medicare PPO | Attending: Nurse Practitioner | Admitting: Cardiology

## 2023-06-16 ENCOUNTER — Encounter: Payer: Self-pay | Admitting: Nurse Practitioner

## 2023-06-16 VITALS — BP 110/60 | HR 56 | Ht 69.0 in | Wt 150.2 lb

## 2023-06-16 DIAGNOSIS — I6523 Occlusion and stenosis of bilateral carotid arteries: Secondary | ICD-10-CM

## 2023-06-16 DIAGNOSIS — E782 Mixed hyperlipidemia: Secondary | ICD-10-CM

## 2023-06-16 DIAGNOSIS — I2510 Atherosclerotic heart disease of native coronary artery without angina pectoris: Secondary | ICD-10-CM | POA: Diagnosis not present

## 2023-06-16 DIAGNOSIS — R001 Bradycardia, unspecified: Secondary | ICD-10-CM

## 2023-06-16 DIAGNOSIS — R0609 Other forms of dyspnea: Secondary | ICD-10-CM

## 2023-06-16 DIAGNOSIS — I10 Essential (primary) hypertension: Secondary | ICD-10-CM | POA: Diagnosis not present

## 2023-06-16 NOTE — Patient Instructions (Signed)
Medication Instructions:  No changes *If you need a refill on your cardiac medications before your next appointment, please call your pharmacy*   Lab Work: None ordered If you have labs (blood work) drawn today and your tests are completely normal, you will receive your results only by: MyChart Message (if you have MyChart) OR A paper copy in the mail If you have any lab test that is abnormal or we need to change your treatment, we will call you to review the results.   Testing/Procedures: None ordered   Follow-Up: At [facility], you and your health needs are our priority.  As part of our continuing mission to provide you with exceptional heart care, we have created designated Provider Care Teams.  These Care Teams include your primary Cardiologist (physician) and Advanced Practice Providers (APPs -  Physician Assistants and Nurse Practitioners) who all work together to provide you with the care you need, when you need it.  We recommend signing up for the patient portal called "MyChart".  Sign up information is provided on this After Visit Summary.  MyChart is used to connect with patients for Virtual Visits (Telemedicine).  Patients are able to view lab/test results, encounter notes, upcoming appointments, etc.  Non-urgent messages can be sent to your provider as well.   To learn more about what you can do with MyChart, go to [email or web address].    Your next appointment:   6 month(s)  Provider:   You may see [name], MD or one of the following Advanced Practice Providers on your designated Care Team:   Nicolasa Ducking, NP

## 2023-12-13 NOTE — Progress Notes (Unsigned)
 Cardiology Office Note  Date:  12/14/2023   ID:  Gerald Specter Sr., DOB 1940-12-28, MRN [id number]  PCP:  Marguarite Arbour, MD   Chief Complaint  Patient presents with   6 month follow up     "Doing well."     HPI:  Mr. Gerald Mcgrath is a 83 year old gentleman with past medical history of smoking, COPD diabetes,  Hyperlipidemia Carotid stenosis, CEA on right 2010  presenting to the hospital October 2019 with unstable angina Troponin elevation  Pain relieved with sublingual nitro Nitropaste and heparin Brought to the cardiac catheterization lab given symptoms of unstable angina, non-STEMI CAD, Stent placed to ramus intermedius Asymptomatic bradycardia Who presents for follow-up of his coronary artery disease  Last seen in clinic by myself 6/23 Last seen by one of our providers September 2024  In follow-up today reports he is feeling well Active, no regular exercise program Previously reported driving his truck or lawnmower down to the mailbox Likes to spend time outdoors Denies anginal symptoms  Denies heartburn symptoms, prior anginal equivalent Chronic low blood pressure in the past, asymptomatic No near-syncope or syncope, denies orthostasis  Prior testing reviewed Stress test 5/24: low risk  Lab work reviewed A1c 6.2 Total cholesterol 137 LDL 49  EKG personally reviewed by myself on todays visit EKG Interpretation Date/Time:  Tuesday December 14 2023 13:45:12 EDT Ventricular Rate:  60 PR Interval:  140 QRS Duration:  98 QT Interval:  420 QTC Calculation: 420 R Axis:   62  Text Interpretation: Normal sinus rhythm with sinus arrhythmia Nonspecific T wave abnormality When compared with ECG of 09-Jul-2018 04:56, Criteria for Inferior infarct are no longer Present ST no longer depressed in Anterior leads QT has shortened Confirmed by [name] [phone]) on 12/14/2023 1:50:04 PM    Other past medical history reviewed Cath 07/08/2018 Severe single-vessel  disease of ramus branch, 100% occluded ostial/proximal region Moderate to severe mid LAD disease, 60%  Successful angioplasty and drug-eluting stent placement to the ramus intermedius.  This was complicated by no reflow due to distal embolization.  This was treated successfully with intracoronary adenosine.  echocardiogram July 28, 2018 results discussed with him in detail Normal ejection fraction 55 to 60%, diastolic dysfunction, mild left atrial enlargement otherwise normal study    PMH:   has a past medical history of Bradycardia, CAD (coronary artery disease), Cancer (HCC), Carotid artery disease (HCC), COPD (chronic obstructive pulmonary disease) (HCC), Diabetes mellitus with complication (HCC), Diastolic dysfunction, Essential hypertension, Fibromyalgia, HLD (hyperlipidemia), Polyneuropathy, and Tubular adenoma of colon.  PSH:    Past Surgical History:  Procedure Laterality Date   CAROTID ENDARTERECTOMY Right    CATARACT EXTRACTION W/PHACO Left 02/02/2018   Procedure: CATARACT EXTRACTION PHACO AND INTRAOCULAR LENS PLACEMENT (IOC) LEFT DIABETIC;  Surgeon: Lockie Mola, MD;  Location: [facility];  Service: Ophthalmology;  Laterality: Left;  DIABETIC   COLONOSCOPY N/A 07/21/2016   Procedure: COLONOSCOPY;  Surgeon: Christena Deem, MD;  Location: [facility];  Service: Endoscopy;  Laterality: N/A;  CBC; PT/ INR   CORONARY STENT INTERVENTION N/A 07/08/2018   Procedure: CORONARY STENT INTERVENTION;  Surgeon: Iran Ouch, MD;  Location: [facility] INVASIVE CV LAB;  Service: Cardiovascular;  Laterality: N/A;   EYE SURGERY     LEFT HEART CATH AND CORONARY ANGIOGRAPHY Right 07/08/2018   Procedure: LEFT HEART CATH AND CORONARY ANGIOGRAPHY possible PCI;  Surgeon: Antonieta Iba, MD;  Location: [facility] INVASIVE CV LAB;  Service: Cardiovascular;  Laterality: Right;  SKIN CANCER EXCISION      Current Outpatient Medications on File Prior to Visit  Medication Sig Dispense  Refill   acetaminophen (TYLENOL) 500 MG tablet Take 1,000 mg by mouth every 6 (six) hours as needed for mild pain.      amitriptyline (ELAVIL) 25 MG tablet Take 1 tablet by mouth at bedtime.     amLODipine (NORVASC) 5 MG tablet Take 1 tablet by mouth daily.     aspirin EC 81 MG tablet Take 1 tablet (81 mg total) by mouth daily. Swallow whole. 90 tablet 3   atorvastatin (LIPITOR) 40 MG tablet Take 1 tablet (40 mg total) by mouth daily. 30 tablet 1   ezetimibe (ZETIA) 10 MG tablet Take 1 tablet (10 mg total) by mouth daily. 90 tablet 3   glimepiride (AMARYL) 2 MG tablet Takes 2 tablets am and 1 tablet pm.     losartan (COZAAR) 100 MG tablet Take 1 tablet (100 mg total) by mouth daily. 90 tablet 1   triamcinolone ointment (KENALOG) 0.5 % Apply 1 application. topically in the morning and at bedtime.     No current facility-administered medications on file prior to visit.     Allergies:   Cephalexin and Penicillins   Social History:  The patient  reports that he quit smoking about 15 years ago. His smoking use included cigarettes. His smokeless tobacco use includes snuff. He reports current alcohol use. He reports that he does not use drugs.   Family History:   family history includes Heart Problems in his brother, mother, and sister; Heart attack in his brother; Heart block in his brother and sister.    Review of Systems: Review of Systems  Constitutional:  Positive for malaise/fatigue.  Respiratory: Negative.    Cardiovascular: Negative.   Gastrointestinal: Negative.   Musculoskeletal: Negative.   Neurological: Negative.   Psychiatric/Behavioral: Negative.    All other systems reviewed and are negative.   PHYSICAL EXAM: VS:  BP (!) 130/50 (BP Location: Left Arm, Patient Position: Sitting, Cuff Size: Normal)   Pulse 60   Ht 5\' 9"  (1.753 m)   Wt 144 lb (65.3 kg)   SpO2 97%   BMI 21.27 kg/m  , BMI Body mass index is 21.27 kg/m. Constitutional:  oriented to person, place, and time.  No distress.  HENT:  Head: Grossly normal Eyes:  no discharge. No scleral icterus.  Neck: No JVD, no carotid bruits  Cardiovascular: Regular rate and rhythm, no murmurs appreciated Pulmonary/Chest: Clear to auscultation bilaterally, no wheezes or rails Abdominal: Soft.  no distension.  no tenderness.  Musculoskeletal: Normal range of motion Neurological:  normal muscle tone. Coordination normal. No atrophy Skin: Skin warm and dry Psychiatric: normal affect, pleasant  Recent Labs: No results found for requested labs within last 365 days.    Lipid Panel Lab Results  Component Value Date   CHOL 135 07/18/2018   HDL 49 07/18/2018   LDLCALC 61 07/18/2018   TRIG 127 07/18/2018      Wt Readings from Last 3 Encounters:  12/14/23 144 lb (65.3 kg)  06/16/23 150 lb 4 oz (68.2 kg)  03/10/23 155 lb 2 oz (70.4 kg)     ASSESSMENT AND PLAN:  Coronary artery disease of native artery of native heart with stable angina pectoris (HCC) - Plan: EKG 12-Lead Currently with no symptoms of angina. No further workup at this time. Continue current medication regimen.  Stay on aspirin, cholesterol at goal, A1c low  Bradycardia Beta-blocker previously held  Rates in the 50s to 60s, asymptomatic  SOB (shortness of breath) Recommend walking program for conditioning long smoking history, likely underlying COPD Denies any recent exacerbations  Mixed hyperlipidemia On Lipitor 80, Zetia 10 mg daily , Cholesterol at goal  Essential hypertension Blood pressure is well controlled on today's visit. No changes made to the medications.  PAD/carotid stenosis Prior CT scan reviewed from 2009, severe disease noted on right Remote history of carotid endarterectomy 2010 on right We have ordered carotid ultrasound for further evaluation   Orders Placed This Encounter  Procedures   EKG 12-Lead     Signed, Dossie Arbour, M.D., Ph.D. 12/14/2023  [facility] [place],  [place] [phone]

## 2023-12-14 ENCOUNTER — Ambulatory Visit: Payer: Medicare PPO | Attending: Cardiovascular Disease | Admitting: Cardiovascular Disease

## 2023-12-14 ENCOUNTER — Encounter: Payer: Self-pay | Admitting: Cardiovascular Disease

## 2023-12-14 VITALS — BP 130/50 | HR 60 | Ht 69.0 in | Wt 144.0 lb

## 2023-12-14 DIAGNOSIS — R0602 Shortness of breath: Secondary | ICD-10-CM

## 2023-12-14 DIAGNOSIS — E782 Mixed hyperlipidemia: Secondary | ICD-10-CM

## 2023-12-14 DIAGNOSIS — R001 Bradycardia, unspecified: Secondary | ICD-10-CM | POA: Diagnosis not present

## 2023-12-14 DIAGNOSIS — I2510 Atherosclerotic heart disease of native coronary artery without angina pectoris: Secondary | ICD-10-CM

## 2023-12-14 DIAGNOSIS — R0609 Other forms of dyspnea: Secondary | ICD-10-CM

## 2023-12-14 DIAGNOSIS — I10 Essential (primary) hypertension: Secondary | ICD-10-CM

## 2023-12-14 DIAGNOSIS — I6523 Occlusion and stenosis of bilateral carotid arteries: Secondary | ICD-10-CM

## 2023-12-14 MED ORDER — ATORVASTATIN CALCIUM 40 MG PO TABS: 40.0000 mg | ORAL_TABLET | Freq: Every day | ORAL | 3 refills | Status: AC

## 2023-12-14 MED ORDER — EZETIMIBE 10 MG PO TABS: 10.0000 mg | ORAL_TABLET | Freq: Every day | ORAL | 3 refills | Status: AC

## 2023-12-14 MED ORDER — LOSARTAN POTASSIUM 100 MG PO TABS: 100.0000 mg | ORAL_TABLET | Freq: Every day | ORAL | 3 refills | Status: AC

## 2023-12-14 NOTE — Patient Instructions (Addendum)
 Medication Instructions:  No changes  If you need a refill on your cardiac medications before your next appointment, please call your pharmacy.   Lab work: No new labs needed  Testing/Procedures: Your physician has requested that you have a carotid duplex. This test is an ultrasound of the carotid arteries in your neck. It looks at blood flow through these arteries that supply the brain with blood.   Allow one hour for this exam.  There are no restrictions or special instructions.  This will take place at [address] [facility]) [address], [place] [address]  Please note: We ask at that you not bring children with you during ultrasound (echo/ vascular) testing. Due to room size and safety concerns, children are not allowed in the ultrasound rooms during exams. Our front office staff cannot provide observation of children in our lobby area while testing is being conducted. An adult accompanying a patient to their appointment will only be allowed in the ultrasound room at the discretion of the ultrasound technician under special circumstances. We apologize for any inconvenience.   Follow-Up: At [facility], you and your health needs are our priority.  As part of our continuing mission to provide you with exceptional heart care, we have created designated Provider Care Teams.  These Care Teams include your primary Cardiologist (physician) and Advanced Practice Providers (APPs -  Physician Assistants and Nurse Practitioners) who all work together to provide you with the care you need, when you need it.  You will need a follow up appointment in 12 months  Providers on your designated Care Team:   Nicolasa Ducking, NP Eula Listen, PA-C Cadence Fransico Michael, [place]  COVID-19 Vaccine Information can be found at: [email or web address] For questions related to vaccine distribution or appointments, please email [email or web address] or  call [phone].

## 2024-01-05 ENCOUNTER — Ambulatory Visit: Attending: Cardiovascular Disease

## 2024-01-05 DIAGNOSIS — I6523 Occlusion and stenosis of bilateral carotid arteries: Secondary | ICD-10-CM

## 2024-01-14 ENCOUNTER — Encounter: Payer: Self-pay | Admitting: Cardiovascular Disease

## 2024-01-17 ENCOUNTER — Encounter: Payer: Self-pay | Admitting: Emergency Medicine
